# Patient Record
Sex: Male | Born: 1985 | Race: White | Hispanic: No | Marital: Single | Smoking: Never smoker
Health system: Southern US, Community
[De-identification: ages and names within clinical notes are randomized; demographics above are authoritative.]

## PROBLEM LIST (undated history)

## (undated) DIAGNOSIS — F79 Unspecified intellectual disabilities: Secondary | ICD-10-CM

## (undated) DIAGNOSIS — R066 Hiccough: Secondary | ICD-10-CM

## (undated) DIAGNOSIS — Z9189 Other specified personal risk factors, not elsewhere classified: Secondary | ICD-10-CM

## (undated) DIAGNOSIS — R1310 Dysphagia, unspecified: Secondary | ICD-10-CM

## (undated) DIAGNOSIS — R2681 Unsteadiness on feet: Secondary | ICD-10-CM

## (undated) DIAGNOSIS — K219 Gastro-esophageal reflux disease without esophagitis: Secondary | ICD-10-CM

## (undated) DIAGNOSIS — Q02 Microcephaly: Secondary | ICD-10-CM

## (undated) DIAGNOSIS — D649 Anemia, unspecified: Secondary | ICD-10-CM

## (undated) HISTORY — PX: CIRCUMCISION: SUR203

---
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
## 2009-09-04 ENCOUNTER — Encounter: Admission: RE | Admit: 2009-09-04 | Discharge: 2009-10-05 | Payer: Self-pay | Admitting: Family Medicine

## 2010-10-09 ENCOUNTER — Ambulatory Visit: Payer: Medicaid Other | Attending: Family Medicine | Admitting: Occupational Therapy

## 2010-10-09 DIAGNOSIS — F79 Unspecified intellectual disabilities: Secondary | ICD-10-CM | POA: Insufficient documentation

## 2010-10-09 DIAGNOSIS — IMO0001 Reserved for inherently not codable concepts without codable children: Secondary | ICD-10-CM | POA: Insufficient documentation

## 2010-10-09 DIAGNOSIS — M2560 Stiffness of unspecified joint, not elsewhere classified: Secondary | ICD-10-CM | POA: Insufficient documentation

## 2011-07-30 ENCOUNTER — Ambulatory Visit: Payer: Medicaid Other | Attending: Family Medicine | Admitting: Occupational Therapy

## 2011-07-30 ENCOUNTER — Ambulatory Visit: Payer: Medicaid Other | Admitting: Rehabilitative and Restorative Service Providers"

## 2011-07-30 DIAGNOSIS — IMO0001 Reserved for inherently not codable concepts without codable children: Secondary | ICD-10-CM | POA: Insufficient documentation

## 2011-07-30 DIAGNOSIS — R4189 Other symptoms and signs involving cognitive functions and awareness: Secondary | ICD-10-CM | POA: Insufficient documentation

## 2012-06-14 ENCOUNTER — Ambulatory Visit (HOSPITAL_COMMUNITY)
Admission: RE | Admit: 2012-06-14 | Discharge: 2012-06-14 | Disposition: A | Discharge status: Home / Self Care | Payer: Medicaid Other | Source: Ambulatory Visit | Attending: Family Medicine | Admitting: Family Medicine

## 2012-06-14 DIAGNOSIS — F79 Unspecified intellectual disabilities: Secondary | ICD-10-CM | POA: Insufficient documentation

## 2012-06-14 DIAGNOSIS — IMO0001 Reserved for inherently not codable concepts without codable children: Secondary | ICD-10-CM | POA: Insufficient documentation

## 2012-06-14 DIAGNOSIS — F8089 Other developmental disorders of speech and language: Secondary | ICD-10-CM | POA: Insufficient documentation

## 2012-06-14 NOTE — Evaluation (Signed)
Speech Language Pathology Evaluation Patient Details  Name: Connor Mata MRN: [id number] Date of Birth: 1986-02-14  Today's Date: 06/14/2012 Time: 1345-1445 SLP Time Calculation (min): 60 min  Authorization: Medicaid  Authorization Time Period:    Authorization Visit#:   of     Past Medical History: No past medical history on file. Past Surgical History: No past surgical history on file.  HPI:  Symptoms/Limitations Symptoms: "I want to go to [employer]." Special Tests: Portions of [employer] of Articulation as well as other informal measures Pain Assessment Currently in Pain?: No/denies  Prior Functional Status  Cognitive/Linguistic Baseline: Baseline deficits Baseline deficit details: Severe developmental delays, mental retardation Type of Home: Other (Comment) (Group home with 6 other residents) Available Help at Discharge: Personal care attendant;Available 24 hours/day  Connor Mata is a 27 yo male with medical history consistent with Severe Mental Retardation, Disruptive Behavior Disorder, Mild Cerebral Palsy, Kyphotic Posture, Knees Bent Gait, Microcephaly, and constipation. He currently resides in [facility] and is accompanied by a driver from his home today. He smiled and held out his hand for me to hold when I greeted him. He told me that he wanted to go to [employer].  Cognition   Informally assessed. Kees has severe mental retardation, but is able to grasp basic concepts.  Comprehension   Loki is able to follow simple directions, understand basic conversations and concepts. He benefits from slower rate of delivery and sometimes repetition. He needs reminders to follow directions in a specific order.   Expression   Quintavious primarily communicates via spoken words, gestures, behaviors, and touch/physical manipulation to express his wants/needs. Due to dysarthria and misarticulations, his speech can be difficult to understand. Specifically, he  substitutes "k" for /d/ and /t/ in most words, "th" for s, sh, and some s-blends, and has difficulty with consonant /l/ and /r/ blends. He speaks in single words and short phrases and is often repetitive/perseverative with questions.   Oral/Motor  Oral Motor/Sensory Function Overall Oral Motor/Sensory Function: Impaired Labial Strength: Reduced Labial Sensation: Within Functional Limits Lingual ROM: Reduced left;Reduced right Lingual Symmetry: Other (Comment) (difficult to move tongue separately from mandible) Lingual Strength: Reduced Lingual Sensation: Within Functional Limits Facial ROM: Within Functional Limits Velum:  (appears reduced) Mandible: Other (Comment) (pt with very high palatal arch, narrow) Motor Speech Overall Motor Speech: Impaired at baseline Respiration: Impaired Level of Impairment: Phrase Phonation: Normal Resonance: Hyponasality Articulation: Impaired Level of Impairment: Word Intelligibility: Intelligibility reduced Word: 25-49% accurate (improves significantly with context) Interfering Components: Premorbid status;Anatomical limitations;Hearing loss Effective Techniques: Slow rate;Pause  SLP Goals   N/A  Assessment/Plan  Kavion's chart was reviewed and his previous recommendations continue to be appropriate today. Rehaan benefits from routine and consistency. He is able to express his basic wants/needs verbally with reduced intelligibility and follow basic concepts and directions. Caregivers/staff would do well to communicate with Brandun in a quiet environment and provide concise requests when completing tasks. Should Jaidon need a speech/language evaluation annually, it would be more appropriate to have a therapist see Abner in his home setting to better assess communication needs and Engineer, mining.  SLP - End of Session Activity Tolerance: Patient tolerated treatment well General Behavior During Session: [facility] for tasks  performed Cognition: Impaired, at baseline     Thank you,  Havery Moros, CCC-SLP [phone]     PORTER,DABNEY 06/14/2012, 11:41 PM   Physician DocumentationYour signature is required to indicate approval of the treatment plan as stated above.  Please  sign and either send electronically or make a copy of this report for your files and return this physician signed original.  Please mark one 1.__approve of plan  2. ___approve of plan with the following conditions.   ______________________________                                                          _____________________ Physician Signature                                                                                                             Date

## 2013-05-03 ENCOUNTER — Emergency Department (HOSPITAL_COMMUNITY)
Admission: EM | Admit: 2013-05-03 | Discharge: 2013-05-03 | Disposition: A | Discharge status: Home / Self Care | Payer: Medicaid Other | Attending: Emergency Medicine | Admitting: Emergency Medicine

## 2013-05-03 ENCOUNTER — Encounter (HOSPITAL_COMMUNITY): Payer: Self-pay | Admitting: Emergency Medicine

## 2013-05-03 DIAGNOSIS — R112 Nausea with vomiting, unspecified: Secondary | ICD-10-CM | POA: Insufficient documentation

## 2013-05-03 DIAGNOSIS — Z79899 Other long term (current) drug therapy: Secondary | ICD-10-CM | POA: Insufficient documentation

## 2013-05-03 DIAGNOSIS — R066 Hiccough: Secondary | ICD-10-CM

## 2013-05-03 DIAGNOSIS — Z8659 Personal history of other mental and behavioral disorders: Secondary | ICD-10-CM | POA: Insufficient documentation

## 2013-05-03 HISTORY — DX: Unspecified intellectual disabilities: F79

## 2013-05-03 LAB — COMPREHENSIVE METABOLIC PANEL
AST: 17 U/L (ref 0–37)
Albumin: 3.5 g/dL (ref 3.5–5.2)
BUN: 12 mg/dL (ref 6–23)
CO2: 28 mEq/L (ref 19–32)
Calcium: 8.9 mg/dL (ref 8.4–10.5)
Creatinine, Ser: 0.82 mg/dL (ref 0.50–1.35)
GFR calc non Af Amer: >90 mL/min (ref >90)
Glucose, Bld: 110 mg/dL — ABNORMAL HIGH (ref 70–99)
Total Protein: 6.8 g/dL (ref 6.0–8.3)

## 2013-05-03 LAB — URINALYSIS, ROUTINE W REFLEX MICROSCOPIC
Bilirubin Urine: NEGATIVE
Hgb urine dipstick: NEGATIVE
Ketones, ur: 15 mg/dL — AB
Nitrite: NEGATIVE
Protein, ur: NEGATIVE mg/dL
Specific Gravity, Urine: 1.022 (ref 1.005–1.030)
Urobilinogen, UA: 4 mg/dL — ABNORMAL HIGH (ref 0.0–1.0)

## 2013-05-03 LAB — CBC WITH DIFFERENTIAL/PLATELET
Basophils Absolute: 0 10*3/uL (ref 0.0–0.1)
Hemoglobin: 10.5 g/dL — ABNORMAL LOW (ref 13.0–17.0)
Lymphocytes Relative: 28 % (ref 12–46)
Lymphs Abs: 1 10*3/uL (ref 0.7–4.0)
MCH: 32.4 pg (ref 26.0–34.0)
MCHC: 36.3 g/dL — ABNORMAL HIGH (ref 30.0–36.0)
Neutro Abs: 2 10*3/uL (ref 1.7–7.7)
Neutrophils Relative %: 55 % (ref 43–77)
Platelets: 182 10*3/uL (ref 150–400)
RBC: 3.24 MIL/uL — ABNORMAL LOW (ref 4.22–5.81)
RDW: 12.5 % (ref 11.5–15.5)
WBC: 3.7 10*3/uL — ABNORMAL LOW (ref 4.0–10.5)

## 2013-05-03 MED ORDER — ONDANSETRON 4 MG PO TBDP
8.0000 mg | ORAL_TABLET | ORAL | Status: AC
  Administered 2013-05-03: 8 mg via ORAL
  Filled 2013-05-03: qty 2

## 2013-05-03 MED ORDER — CHLORPROMAZINE HCL 25 MG PO TABS: 25.0000 mg | ORAL_TABLET | Freq: Three times a day (TID) | ORAL | Status: DC

## 2013-05-03 MED ORDER — ONDANSETRON HCL 4 MG/2ML IJ SOLN
4.0000 mg | Freq: Once | INTRAMUSCULAR | Status: AC
  Administered 2013-05-03: 4 mg via INTRAVENOUS
  Filled 2013-05-03: qty 2

## 2013-05-03 MED ORDER — SODIUM CHLORIDE 0.9 % IV BOLUS (SEPSIS)
1000.0000 mL | Freq: Once | INTRAVENOUS | Status: AC
  Administered 2013-05-03: 1000 mL via INTRAVENOUS

## 2013-05-03 MED ORDER — SODIUM CHLORIDE 0.9 % IV SOLN
50.0000 mg | Freq: Once | INTRAVENOUS | Status: AC
  Administered 2013-05-03: 50 mg via INTRAVENOUS
  Filled 2013-05-03: qty 2

## 2013-05-03 MED ORDER — ONDANSETRON 4 MG PO TBDP: 4.0000 mg | ORAL_TABLET | Freq: Three times a day (TID) | ORAL | Status: DC | PRN

## 2013-05-03 MED ORDER — OMEPRAZOLE 20 MG PO CPDR: 20.0000 mg | DELAYED_RELEASE_CAPSULE | Freq: Two times a day (BID) | ORAL | Status: DC

## 2013-05-03 MED ORDER — PANTOPRAZOLE SODIUM 40 MG IV SOLR
40.0000 mg | Freq: Once | INTRAVENOUS | Status: AC
  Administered 2013-05-03: 40 mg via INTRAVENOUS
  Filled 2013-05-03: qty 40

## 2013-05-03 NOTE — ED Notes (Signed)
Pt here from group home with family and care givers c/o hiccups x 1 week and N/V with some dark colored emesis on Saturday and then again today; pt started on paxil beginning of last week and decreased PO intake c/o abd pain; pt with hx of MR

## 2013-05-03 NOTE — ED Notes (Signed)
Patient has MR and is unable to give details on his pain level or description.

## 2013-05-03 NOTE — ED Provider Notes (Signed)
CSN: [id number]     Arrival date & time 05/03/13  1332 History   First MD Initiated Contact with Patient 05/03/13 1655     Chief Complaint  Patient presents with  . Abdominal Pain  . Emesis    HPI  Patient presents with his mother. His history of mental retardation and stays in a group home. He stays with mom every other weekend. Her last week he has had hiccups almost constantly. He said a few episodes of vomiting Sunday night and yesterday and then today. Sooner some "dark black" in his vomit but no frank blood. Mom cleaned up his emesis he did not turn red being cleaned. Is not taking any other medicines that are new for him.  He has a history of chronic leukopenia and mild anemia with a baseline hemoglobin of 10  Past Medical History  Diagnosis Date  . Mental retardation    History reviewed. No pertinent past surgical history. History reviewed. No pertinent family history. History  Substance Use Topics  . Smoking status: Never Smoker   . Smokeless tobacco: Not on file  . Alcohol Use: No    Review of Systems  Unable to perform ROS: Other  Constitutional: Negative for fever.  Respiratory: Negative for cough.   Gastrointestinal: Positive for nausea and abdominal pain.       Hiccups  Genitourinary: Negative for difficulty urinating.  Psychiatric/Behavioral: Negative for behavioral problems and agitation.    Allergies  Review of patient's allergies indicates no known allergies.  Home Medications   Current Outpatient Rx  Name  Route  Sig  Dispense  Refill  . clonazePAM (KLONOPIN) 1 MG tablet   Oral   Take 1 mg by mouth 2 (two) times daily.         . iron polysaccharides (NIFEREX) 150 MG capsule   Oral   Take 150 mg by mouth daily.         Marland Kitchen PARoxetine (PAXIL) 10 MG tablet   Oral   Take 10 mg by mouth daily.         . polyethylene glycol (MIRALAX / GLYCOLAX) packet   Oral   Take 17 g by mouth daily.         . risperidone (RISPERDAL) 4 MG tablet    Oral   Take 4 mg by mouth at bedtime.         . traZODone (DESYREL) 100 MG tablet   Oral   Take 100 mg by mouth at bedtime.         . chlorproMAZINE (THORAZINE) 25 MG tablet   Oral   Take 1 tablet (25 mg total) by mouth 3 (three) times daily.   30 tablet   0   . omeprazole (PRILOSEC) 20 MG capsule   Oral   Take 1 capsule (20 mg total) by mouth 2 (two) times daily.   60 capsule   0   . ondansetron (ZOFRAN ODT) 4 MG disintegrating tablet   Oral   Take 1 tablet (4 mg total) by mouth every 8 (eight) hours as needed for nausea.   20 tablet   0    BP 113/55  Pulse 96  Temp(Src) 98.7 F (37.1 C) (Oral)  Resp 16  SpO2 97% Physical Exam  Constitutional: He appears well-developed and well-nourished.  Hiccups approximately Once per Minute  HENT:  Pink and moist  Eyes:  Anicteric. Not pale.  Cardiovascular: S2 normal.   Pulmonary/Chest: He has no decreased breath sounds. He has no  wheezes. He has no rhonchi. He has no rales.  Abdominal:  Soft. Does not react as it is painful.  Skin:  Normal Capillary refill    ED Course  Procedures (including critical care time) Labs Review Labs Reviewed  CBC WITH DIFFERENTIAL - Abnormal; Notable for the following:    WBC 3.7 (*)    RBC 3.24 (*)    Hemoglobin 10.5 (*)    HCT 28.9 (*)    MCHC 36.3 (*)    Monocytes Relative 15 (*)    All other components within normal limits  COMPREHENSIVE METABOLIC PANEL - Abnormal; Notable for the following:    Glucose, Bld 110 (*)    All other components within normal limits  URINALYSIS, ROUTINE W REFLEX MICROSCOPIC - Abnormal; Notable for the following:    APPearance CLOUDY (*)    Ketones, ur 15 (*)    Urobilinogen, UA 4.0 (*)    All other components within normal limits  LIPASE, BLOOD   Imaging Review No results found.  EKG Interpretation   None       MDM   1. Hiccups    He is given IV Thorazine. He was given IV Protonix. He was given Zofran. He was sleeping comfortably  precise occasional hiccups. He decreased in frequency but not resolved. His hemoglobin is stable.  Symptoms started time he started Paxil. Although was not documented the Paxil as hiccups or masses providers to stop his Paxil. Given a prescription for Thorazine. Given a prescription for Zofran. He is given a prescription for Prilosec. Given referral for GI progressing to follow with his primary care physician and/or GI symptoms not resolve.  I've also asked that they stop his Risperdal to take the Thorazine so that he would not be overly sedate    Roney Marion, MD 05/03/13 2048

## 2013-06-06 ENCOUNTER — Other Ambulatory Visit: Payer: Self-pay | Admitting: Gastroenterology

## 2013-06-10 ENCOUNTER — Encounter (HOSPITAL_COMMUNITY): Payer: Self-pay | Admitting: Pharmacy Technician

## 2013-06-10 ENCOUNTER — Encounter (HOSPITAL_COMMUNITY): Payer: Self-pay | Admitting: *Deleted

## 2013-06-10 DIAGNOSIS — R1310 Dysphagia, unspecified: Secondary | ICD-10-CM

## 2013-06-10 DIAGNOSIS — Z9189 Other specified personal risk factors, not elsewhere classified: Secondary | ICD-10-CM

## 2013-06-10 DIAGNOSIS — R066 Hiccough: Secondary | ICD-10-CM

## 2013-06-10 DIAGNOSIS — D649 Anemia, unspecified: Secondary | ICD-10-CM

## 2013-06-10 DIAGNOSIS — R2681 Unsteadiness on feet: Secondary | ICD-10-CM

## 2013-06-10 HISTORY — DX: Other specified personal risk factors, not elsewhere classified: Z91.89

## 2013-06-10 HISTORY — DX: Unsteadiness on feet: R26.81

## 2013-06-10 HISTORY — DX: Anemia, unspecified: D64.9

## 2013-06-10 HISTORY — DX: Hiccough: R06.6

## 2013-06-10 HISTORY — DX: Dysphagia, unspecified: R13.10

## 2013-06-10 NOTE — Progress Notes (Signed)
06-10-13 Spoke with Dr. Acey Lavarignan of mother concerns- pt." May get too anxious and hard to handle", reassured they would be ready to handle situation with meds if needed.W. Kennon PortelaHendrick,RN

## 2013-06-17 ENCOUNTER — Encounter (HOSPITAL_COMMUNITY)
Admission: RE | Disposition: A | Discharge status: Home / Self Care | Payer: Self-pay | Source: Ambulatory Visit | Attending: Gastroenterology

## 2013-06-17 ENCOUNTER — Encounter (HOSPITAL_COMMUNITY): Payer: Medicaid Other | Admitting: Registered Nurse

## 2013-06-17 ENCOUNTER — Ambulatory Visit (HOSPITAL_COMMUNITY)
Admission: RE | Admit: 2013-06-17 | Discharge: 2013-06-17 | Disposition: A | Discharge status: Home / Self Care | Payer: Medicaid Other | Source: Ambulatory Visit | Attending: Gastroenterology | Admitting: Gastroenterology

## 2013-06-17 ENCOUNTER — Encounter (HOSPITAL_COMMUNITY): Payer: Self-pay

## 2013-06-17 ENCOUNTER — Ambulatory Visit (HOSPITAL_COMMUNITY): Payer: Medicaid Other | Admitting: Registered Nurse

## 2013-06-17 DIAGNOSIS — R1310 Dysphagia, unspecified: Secondary | ICD-10-CM | POA: Insufficient documentation

## 2013-06-17 DIAGNOSIS — R066 Hiccough: Secondary | ICD-10-CM | POA: Insufficient documentation

## 2013-06-17 DIAGNOSIS — K219 Gastro-esophageal reflux disease without esophagitis: Secondary | ICD-10-CM | POA: Insufficient documentation

## 2013-06-17 DIAGNOSIS — D649 Anemia, unspecified: Secondary | ICD-10-CM | POA: Insufficient documentation

## 2013-06-17 DIAGNOSIS — F79 Unspecified intellectual disabilities: Secondary | ICD-10-CM | POA: Insufficient documentation

## 2013-06-17 DIAGNOSIS — K449 Diaphragmatic hernia without obstruction or gangrene: Secondary | ICD-10-CM | POA: Insufficient documentation

## 2013-06-17 HISTORY — DX: Dysphagia, unspecified: R13.10

## 2013-06-17 HISTORY — PX: ESOPHAGOGASTRODUODENOSCOPY: SHX5428

## 2013-06-17 HISTORY — DX: Gastro-esophageal reflux disease without esophagitis: K21.9

## 2013-06-17 HISTORY — DX: Hiccough: R06.6

## 2013-06-17 HISTORY — DX: Other specified personal risk factors, not elsewhere classified: Z91.89

## 2013-06-17 HISTORY — DX: Unsteadiness on feet: R26.81

## 2013-06-17 HISTORY — DX: Anemia, unspecified: D64.9

## 2013-06-17 SURGERY — EGD (ESOPHAGOGASTRODUODENOSCOPY)
Anesthesia: Monitor Anesthesia Care

## 2013-06-17 MED ORDER — KETAMINE HCL 10 MG/ML IJ SOLN
INTRAMUSCULAR | Status: DC | PRN
  Administered 2013-06-17 (×2): 10 mg via INTRAVENOUS

## 2013-06-17 MED ORDER — SODIUM CHLORIDE 0.9 % IV SOLN: INTRAVENOUS | Status: DC

## 2013-06-17 MED ORDER — LIDOCAINE HCL (CARDIAC) 20 MG/ML IV SOLN
INTRAVENOUS | Status: DC | PRN
  Administered 2013-06-17: 50 mg via INTRAVENOUS

## 2013-06-17 MED ORDER — EPHEDRINE SULFATE 50 MG/ML IJ SOLN
INTRAMUSCULAR | Status: AC
  Filled 2013-06-17: qty 1

## 2013-06-17 MED ORDER — SODIUM CHLORIDE 0.9 % IJ SOLN
INTRAMUSCULAR | Status: AC
  Filled 2013-06-17: qty 10

## 2013-06-17 MED ORDER — PROPOFOL 10 MG/ML IV BOLUS
INTRAVENOUS | Status: AC
  Filled 2013-06-17: qty 20

## 2013-06-17 MED ORDER — MIDAZOLAM HCL 2 MG/2ML IJ SOLN
INTRAMUSCULAR | Status: AC
  Filled 2013-06-17: qty 2

## 2013-06-17 MED ORDER — KETAMINE HCL 10 MG/ML IJ SOLN
INTRAMUSCULAR | Status: AC
  Filled 2013-06-17: qty 1

## 2013-06-17 MED ORDER — PROPOFOL INFUSION 10 MG/ML OPTIME
INTRAVENOUS | Status: DC | PRN
  Administered 2013-06-17: 75 ug/kg/min via INTRAVENOUS

## 2013-06-17 MED ORDER — MIDAZOLAM HCL 5 MG/5ML IJ SOLN
INTRAMUSCULAR | Status: DC | PRN
  Administered 2013-06-17: .25 mg via INTRAVENOUS

## 2013-06-17 MED ORDER — LACTATED RINGERS IV SOLN
INTRAVENOUS | Status: DC
  Administered 2013-06-17: 10:00:00 via INTRAVENOUS

## 2013-06-17 MED ORDER — LIDOCAINE HCL (CARDIAC) 20 MG/ML IV SOLN
INTRAVENOUS | Status: AC
  Filled 2013-06-17: qty 5

## 2013-06-17 NOTE — Preoperative (Signed)
Beta Blockers   Reason not to administer Beta Blockers:Not Applicable 

## 2013-06-17 NOTE — H&P (Signed)
  Connor Mata HPI: This is a 28 year old male who was hospitalized for persistent singultus this past December.  He started to have issues with singultus a few weeks before, but on the da of admission, he had hiccups for 10 hours.  He was treated with thorazine and his symptoms abated.  The patient was also treated with BID omeprazole, but his records reflect that he has been on the medication before.  The patient is mentally retarded and his mother reports taht he started to have issues with swallowing.  He has been better over the past month, but he will have intermittent breakthroughs of his symptoms.  When he was in the ER he had coffee-ground emesis.  His mother wonders if there was any correlation with his antidepressant medications.  Past Medical History  Diagnosis Date  . GERD (gastroesophageal reflux disease)   . Anemia 06-10-13    hx. "low white cell count" "takes iron"-iron deficiency   . Swallowing problem 06-10-13    "not swallowing well with foods, meds okay"   . Potential for alteration in nutrition and elimination 06-10-13    identifies with prompt words "pea and poo" for elimination on schedule  . Hiccoughs 06-10-13    Intermittent periods of hiccoughs-tx. Thorazine.Extreme case x7days-caused vomiting of coffe ground material.  . Gait instability 06-10-13    unsteady gait  . Mental retardation     "repetive speech,hard to understand-"follows prompt commands"    Past Surgical History  Procedure Laterality Date  . Circumcision      History reviewed. No pertinent family history.  Social History:  reports that he has never smoked. He does not have any smokeless tobacco history on file. He reports that he does not drink alcohol or use illicit drugs.  Allergies: No Known Allergies  Medications:  Scheduled:  Continuous: . sodium chloride    . lactated ringers 50 mL/hr at 06/17/13 1008    No results found for this or any previous visit (from the past 24 hour(s)).   No  results found.  ROS:  As stated above in the HPI otherwise negative.  Blood pressure 102/61, pulse 58, temperature 97.5 F (36.4 C), temperature source Oral, resp. rate 10, height 4\' 7"  (1.397 m), weight 100 lb (45.36 kg), SpO2 100.00%.    PE: Gen: NAD, Alert and Oriented HEENT:  [place]/AT, EOMI Neck: Supple, no LAD Lungs: CTA Bilaterally CV: RRR without M/G/R ABM: Soft, NTND, +BS Ext: No C/C/E  Assessment/Plan: 1) Singultus. 2) Dysphagia.  Plan: 1) EGD.  Connor Mata 06/17/2013, 10:30 AM

## 2013-06-17 NOTE — Discharge Instructions (Signed)

## 2013-06-17 NOTE — Anesthesia Preprocedure Evaluation (Addendum)
Anesthesia Evaluation  Patient identified by MRN, date of birth, ID band Patient awake    Reviewed: Allergy & Precautions, H&P , NPO status , Patient's Chart, lab work & pertinent test results  Airway Mallampati: II TM Distance: >3 FB Neck ROM: Full    Dental  (+) Dental Advisory Given   Pulmonary neg pulmonary ROS,  breath sounds clear to auscultation        Cardiovascular negative cardio ROS  Rhythm:Regular Rate:Normal     Neuro/Psych negative neurological ROS  negative psych ROS   GI/Hepatic Neg liver ROS, GERD-  Medicated,  Endo/Other  negative endocrine ROS  Renal/GU negative Renal ROS     Musculoskeletal negative musculoskeletal ROS (+)   Abdominal   Peds  Hematology negative hematology ROS (+) anemia ,   Anesthesia Other Findings   Reproductive/Obstetrics                         Anesthesia Physical Anesthesia Plan  ASA: III  Anesthesia Plan: MAC   Post-op Pain Management:    Induction:   Airway Management Planned:   Additional Equipment:   Intra-op Plan:   Post-operative Plan:   Informed Consent: I have reviewed the patients History and Physical, chart, labs and discussed the procedure including the risks, benefits and alternatives for the proposed anesthesia with the patient or authorized representative who has indicated his/her understanding and acceptance.   Dental advisory given  Plan Discussed with: CRNA  Anesthesia Plan Comments:         Anesthesia Quick Evaluation

## 2013-06-17 NOTE — Anesthesia Postprocedure Evaluation (Signed)
Anesthesia Post Note  Patient: Connor Mata  Procedure(s) Performed: Procedure(s) (LRB): ESOPHAGOGASTRODUODENOSCOPY (EGD) (N/A)  Anesthesia type: MAC  Patient location: PACU  Post pain: Pain level controlled  Post assessment: Post-op Vital signs reviewed  Last Vitals: BP 94/65  Pulse 58  Temp(Src) 36.4 C (Oral)  Resp 10  Ht 4\' 7"  (1.397 m)  Wt 100 lb (45.36 kg)  BMI 23.24 kg/m2  SpO2 100%  Post vital signs: Reviewed  Level of consciousness: awake  Complications: No apparent anesthesia complications

## 2013-06-17 NOTE — Op Note (Signed)
[facility] [address] [place], [id number]   OPERATIVE PROCEDURE REPORT  PATIENT: [name], Connor  MR#: [id number] BIRTHDATE: 31-Mar-1986  GENDER: Male ENDOSCOPIST: Jeani HawkingPatrick Osceola Holian, MD ASSISTANT:   Tomma RakersBethany Bocanegra, RN, BSN, Windell HummingbirdSamuel Tetteh, technician, and Morene Antueginald Martin, PROCEDURE DATE: 06/17/2013 PROCEDURE:   EGD with biopsies ASA CLASS:   Class III INDICATIONS: Dysphagia and singultus MEDICATIONS: MAC sedation, administered by CRNA TOPICAL ANESTHETIC:   none  DESCRIPTION OF PROCEDURE:   After the risks benefits and alternatives of the procedure were thoroughly explained, informed consent was obtained.  The endoscope [id number]  endoscope was introduced through the mouth  and advanced to the second portion of the duodenum Without limitations.      The instrument was slowly withdrawn as the mucosa was fully examined.      FINDINGS: A small cervical inlet patch was identified in the proximal esophagus.  Cold biopsies of the esophagus were obtained for the possibility of EoE.  A 2-3 cm sliding hiatal hernia was found.  The Z-line subjectively appeared to have some irritation suggestive of GERD.  No other abnormalities identified. The scope was then withdrawn from the patient and the procedure terminated.  COMPLICATIONS: There were no complications. IMPRESSION: 1) Cervial inlet patch. 2) ? EoE. 3) Hiatal hernia.  RECOMMENDATIONS: 1) Await biopsy results. 2) Continue with PPI.  _______________________________ eSignedJeani Hawking:  Grisell Bissette, MD 06/17/2013 11:17 AM

## 2013-06-17 NOTE — Transfer of Care (Signed)
Immediate Anesthesia Transfer of Care Note  Patient: Connor Mata  Procedure(s) Performed: Procedure(s): ESOPHAGOGASTRODUODENOSCOPY (EGD) (N/A)  Patient Location: Endoscopy Unit  Anesthesia Type:MAC  Level of Consciousness: awake, alert , oriented and patient cooperative  Airway & Oxygen Therapy: Patient Spontanous Breathing and Patient connected to face mask oxygen  Post-op Assessment: Report given to PACU RN, Post -op Vital signs reviewed and stable and Patient moving all extremities  Post vital signs: Reviewed and stable  Complications: No apparent anesthesia complications

## 2013-06-20 ENCOUNTER — Encounter (HOSPITAL_COMMUNITY): Payer: Self-pay | Admitting: Gastroenterology

## 2013-09-06 ENCOUNTER — Encounter (HOSPITAL_COMMUNITY): Payer: Self-pay | Admitting: Emergency Medicine

## 2013-09-06 ENCOUNTER — Emergency Department (HOSPITAL_COMMUNITY)
Admission: EM | Admit: 2013-09-06 | Discharge: 2013-09-06 | Disposition: A | Discharge status: Home / Self Care | Payer: Medicaid Other | Attending: Emergency Medicine | Admitting: Emergency Medicine

## 2013-09-06 DIAGNOSIS — F79 Unspecified intellectual disabilities: Secondary | ICD-10-CM | POA: Insufficient documentation

## 2013-09-06 DIAGNOSIS — Z789 Other specified health status: Secondary | ICD-10-CM | POA: Insufficient documentation

## 2013-09-06 DIAGNOSIS — D649 Anemia, unspecified: Secondary | ICD-10-CM | POA: Insufficient documentation

## 2013-09-06 DIAGNOSIS — Z79899 Other long term (current) drug therapy: Secondary | ICD-10-CM | POA: Insufficient documentation

## 2013-09-06 DIAGNOSIS — R1310 Dysphagia, unspecified: Secondary | ICD-10-CM | POA: Insufficient documentation

## 2013-09-06 DIAGNOSIS — K219 Gastro-esophageal reflux disease without esophagitis: Secondary | ICD-10-CM | POA: Insufficient documentation

## 2013-09-06 DIAGNOSIS — R066 Hiccough: Secondary | ICD-10-CM | POA: Insufficient documentation

## 2013-09-06 DIAGNOSIS — R269 Unspecified abnormalities of gait and mobility: Secondary | ICD-10-CM | POA: Insufficient documentation

## 2013-09-06 LAB — I-STAT CHEM 8, ED
BUN: 12 mg/dL (ref 6–23)
CALCIUM ION: 1.19 mmol/L (ref 1.12–1.23)
CHLORIDE: 101 meq/L (ref 96–112)
Creatinine, Ser: 1.1 mg/dL (ref 0.50–1.35)
Glucose, Bld: 83 mg/dL (ref 70–99)
HCT: 37 % — ABNORMAL LOW (ref 39.0–52.0)
HEMOGLOBIN: 12.6 g/dL — AB (ref 13.0–17.0)
Potassium: 3.9 mEq/L (ref 3.7–5.3)
Sodium: 140 mEq/L (ref 137–147)
TCO2: 26 mmol/L (ref 0–100)

## 2013-09-06 MED ORDER — CHLORPROMAZINE HCL 25 MG/ML IJ SOLN
50.0000 mg | Freq: Once | INTRAMUSCULAR | Status: AC
  Administered 2013-09-06: 50 mg via INTRAMUSCULAR
  Filled 2013-09-06: qty 2

## 2013-09-06 MED ORDER — CHLORPROMAZINE HCL 25 MG PO TABS
25.0000 mg | ORAL_TABLET | Freq: Once | ORAL | Status: AC
  Administered 2013-09-06: 25 mg via ORAL
  Filled 2013-09-06: qty 1

## 2013-09-06 NOTE — ED Notes (Signed)
Wylene SimmerFrancis Sanford, PA at the bedside

## 2013-09-06 NOTE — Discharge Instructions (Signed)
Hiccups °Hiccups are caused by a sudden contraction of the muscles between the ribs and the muscle under your lungs (diaphragm). When you hiccup, the top of your windpipe (glottis) closes immediately after your diaphragm contracts. This makes the typical 'hic' sound. A hiccup is a reflex that you cannot stop. Unlike other reflexes such as coughing and sneezing, hiccups do not seem to have any useful purpose. There are 3 types of hiccups:  °· Benign bouts: last less than 48 hours. °· Persistent: last more than 48 hours but less than 1 month. °· Intractable: last more than 1 month. °CAUSES  °Most people have bouts of hiccups from time to time. They start for no apparent reason, last a short while, and then stop. Sometimes they are due to: °· A temporary swollen stomach caused by overeating or eating too fast, eating spicy foods, drinking fizzy drinks, or swallowing air. °· A sudden change in temperature (very hot or cold foods or drinks, a cold shower). °· Drinking alcohol or using tobacco. °There are no particular tests used to diagnose hiccups. Hiccups are usually considered harmless and do not point to a serious medical condition. However, there can be underlying medical problems that may cause hiccups, such as pneumonia, diabetes, metabolic problems, tumors, abdominal infections or injuries, and neurologic problems. You must follow up with your caregiver if your symptoms persist or become a frequent problem. °TREATMENT  °· Most cases need no treatment. A bout of benign hiccups usually does not last long. °· Medicine is sometimes needed to stop persistent hiccups. Medicine may be given intravenously (IV) or by mouth. °· Hypnosis or acupuncture may be suggested. °· Surgery to affect the nerve that supplies the diaphragm may be tried in severe cases. °· Treatment of an underlying cause is needed in some cases. °HOME CARE INSTRUCTIONS  °Popular remedies that may stop a bout of hiccups include: °· Gargling ice  water. °· Swallowing granulated sugar. °· Biting on a lemon. °· Holding your breath, breathing fast, or breathing into a paper bag. °· Bearing down. °· Gasping after a sudden fright. °· Pulling your tongue gently. °· Distraction. °SEEK MEDICAL CARE IF:  °· Hiccups last for more than 48 hours. °· You are given medicine but your hiccups do not get better. °· New symptoms show up. °· You cannot sleep or eat due to the hiccups. °· You have unexpected weight loss. °· You have trouble breathing or swallowing. °· You develop severe pain in your abdomen or other areas. °· You develop numbness, tingling, or weakness. °Document Released: 07/14/2001 Document Revised: 07/28/2011 Document Reviewed: 06/26/2010 °[employer]® Patient Information ©2014 [employer]. ° °

## 2013-09-06 NOTE — ED Notes (Addendum)
Patient presents with hiccups for 24 hours per family.  Per family he is lethargic from not sleeping through the night due to the hiccups, patient has MR, lives in a group home.  Patient has been seen for these symptoms before, is followed by Dr. Elnoria HowardHung.  Patient as a lot of secretions but patient is able to clear them, airway patent

## 2013-09-06 NOTE — ED Provider Notes (Signed)
Medical screening examination/treatment/procedure(s) were performed by non-physician practitioner and as supervising physician I was immediately available for consultation/collaboration.   EKG Interpretation None       Arleene Settle, MD 09/06/13 2040 

## 2013-09-06 NOTE — ED Provider Notes (Signed)
CSN: [id number]     Arrival date & time 09/06/13  1348 History   First MD Initiated Contact with Patient 09/06/13 1514     Chief Complaint  Patient presents with  . Hiccups     (Consider location/radiation/quality/duration/timing/severity/associated sxs/prior Treatment) HPI Comments: Patient is 28 year old male with history of chronic hiccups, MR and GERD who presents to the ED with his parents after they were called by the group home with a 24 hour history of intractible hiccoughs.  He has a history of same and 2 weeks ago she was able to bring him home, give him the oral thorazine with relief.  She states that previously he has had a similar episode which resulted in admission to the hospital because he began to have GI bleeding related to this.  She states that he has not wanted to eat and drink and that according to the group home he did not sleep well last night.  He denies any complaints at this time.  The history is provided by a parent. The history is limited by a developmental delay. No language interpreter was used.    Past Medical History  Diagnosis Date  . GERD (gastroesophageal reflux disease)   . Anemia 06-10-13    hx. "low white cell count" "takes iron"-iron deficiency   . Swallowing problem 06-10-13    "not swallowing well with foods, meds okay"   . Potential for alteration in nutrition and elimination 06-10-13    identifies with prompt words "pea and poo" for elimination on schedule  . Hiccoughs 06-10-13    Intermittent periods of hiccoughs-tx. Thorazine.Extreme case x7days-caused vomiting of coffe ground material.  . Gait instability 06-10-13    unsteady gait  . Mental retardation     "repetive speech,hard to understand-"follows prompt commands"   Past Surgical History  Procedure Laterality Date  . Circumcision    . Esophagogastroduodenoscopy N/A 06/17/2013    Procedure: ESOPHAGOGASTRODUODENOSCOPY (EGD);  Surgeon: Theda BelfastPatrick D Hung, MD;  Location: [facility];  Service:  Endoscopy;  Laterality: N/A;   No family history on file. History  Substance Use Topics  . Smoking status: Never Smoker   . Smokeless tobacco: Not on file  . Alcohol Use: No    Review of Systems  All other systems reviewed and are negative.     Allergies  Review of patient's allergies indicates no known allergies.  Home Medications   Prior to Admission medications   Medication Sig Start Date End Date Taking? Authorizing Provider  Camphor-Eucalyptus-Menthol (VICKS VAPORUB EX) Apply 1 application topically at bedtime. Applies to fingernails on fingers and toes for fungal infection.   Yes Historical Provider, MD  chlorproMAZINE (THORAZINE) 25 MG tablet Take 25 mg by mouth 3 (three) times daily as needed (For hiccups.).   Yes Historical Provider, MD  clonazePAM (KLONOPIN) 1 MG tablet Take 1 mg by mouth 2 (two) times daily.   Yes Historical Provider, MD  Fluvoxamine Maleate 150 MG CP24 Take 1 capsule by mouth at bedtime.   Yes Historical Provider, MD  iron polysaccharides (NIFEREX) 150 MG capsule Take 150 mg by mouth every morning.    Yes Historical Provider, MD  omeprazole (PRILOSEC) 20 MG capsule Take 20 mg by mouth every morning.   Yes Historical Provider, MD  ondansetron (ZOFRAN ODT) 4 MG disintegrating tablet Take 1 tablet (4 mg total) by mouth every 8 (eight) hours as needed for nausea. 05/03/13  Yes Rolland PorterMark James, MD  polyethylene glycol [facility](MIRALAX / GLYCOLAX) packet Take 17  g by mouth every morning.    Yes Historical Provider, MD  risperidone (RISPERDAL) 4 MG tablet Take 4 mg by mouth at bedtime.   Yes Historical Provider, MD  traZODone (DESYREL) 100 MG tablet Take 100 mg by mouth at bedtime.   Yes Historical Provider, MD   BP 114/73  Pulse 57  Temp(Src) 98 F (36.7 C) (Oral)  Resp 18  SpO2 99% Physical Exam  Nursing note and vitals reviewed. Constitutional: He is oriented to person, place, and time. He appears well-developed and well-nourished. No distress.  HENT:  Head:  Normocephalic and atraumatic.  Right Ear: External ear normal.  Left Ear: External ear normal.  Nose: Nose normal.  Mouth/Throat: Oropharynx is clear and moist. No oropharyngeal exudate.  Eyes: Conjunctivae are normal. Pupils are equal, round, and reactive to light. No scleral icterus.  Neck: Normal range of motion. Neck supple.  Cardiovascular: Normal rate, regular rhythm and normal heart sounds.  Exam reveals no gallop and no friction rub.   No murmur heard. Pulmonary/Chest: Effort normal and breath sounds normal. No respiratory distress. He has no wheezes. He has no rales. He exhibits no tenderness.  Abdominal: Soft. Bowel sounds are normal. He exhibits no distension. There is no tenderness. There is no rebound and no guarding.  hiccough  Musculoskeletal: Normal range of motion. He exhibits no edema and no tenderness.  Lymphadenopathy:    He has no cervical adenopathy.  Neurological: He is alert and oriented to person, place, and time. He exhibits normal muscle tone. Coordination normal.  Skin: Skin is warm and dry. No rash noted. No erythema. No pallor.  Psychiatric: He has a normal mood and affect. His behavior is normal. Judgment and thought content normal.    ED Course  Procedures (including critical care time) Labs Review Labs Reviewed  I-STAT CHEM 8, ED - Abnormal; Notable for the following:    Hemoglobin 12.6 (*)    HCT 37.0 (*)    All other components within normal limits    Imaging Review No results found.   EKG Interpretation None      Medications  chlorproMAZINE (THORAZINE) tablet 25 mg (25 mg Oral Given 09/06/13 1551)  chlorproMAZINE (THORAZINE) injection 50 mg (50 mg Intramuscular Given 09/06/13 1741)     MDM   Intractible hiccoughs  Patient here with parents who reports history of intractable hiccoughs, after IM injection of thorazine, this has now resolved.  No sign of dehydration and mother reports ready to take the patient home.    Izola PriceFrances C.  Marisue HumbleSanford, New JerseyPA-C 09/06/13 1921

## 2013-09-06 NOTE — ED Notes (Signed)
Pt has had recurrent episodes of hiccups, today has been going on for 24 hours. Mom states given thorazine tablets, but did not give today due to ongoing sedation from medicine.

## 2014-09-19 ENCOUNTER — Ambulatory Visit: Payer: Medicaid Other | Attending: Family Medicine | Admitting: Occupational Therapy

## 2015-08-20 ENCOUNTER — Other Ambulatory Visit: Payer: Self-pay | Admitting: Gastroenterology

## 2015-08-29 ENCOUNTER — Encounter (HOSPITAL_COMMUNITY): Payer: Self-pay | Admitting: *Deleted

## 2015-08-29 NOTE — Progress Notes (Signed)
Preop instructions for:  Connor Mata                       Date of Birth     06-Aug-1985                       Date of Procedure: 09-07-2015      Doctor: Connor Mata [facility]12 noon Report to: Admitting Procedure time:   1330 -1415 Procedure: Esophagogastroduodenoscopy Any procedure time changes, MD office will notify you!   Do not eat or drink past midnight the night before your procedure.(To include any tube feedings-must be discontinued): Exception may have Clear Liquids 12 midnight to 0700 AM day of procedure. Reminder:Follow bowel prep instructions per MD office!   Take these morning medications only with sips of water.(or give through gastrostomy or feeding tube).  Benztropine. Focalin. Invega. Clonazepam. Note: No Insulin or Diabetic meds should be given or taken the morning of the procedure!   Facility contact:  Connor SaxEmma Mata- medical tech                Phone: [phone]                  Health Care POA: Connor Mata, Mata [phone] cell(currently out of country)States" gave verbal consent already".  Transportation contact phone#: Connor Saxmma Mata [phone]  Please send day of procedure:current med list and with times meds last taken that day, confirm nothing by mouth status from what time, Patient Demographic info( to include DNR status, problem list, allergies)   RN contact name/phone#: Connor Mata [phone]                           and Fax #:[phone]  Connor InstrumentsBring Insurance card and picture ID Leave all jewelry and other valuables at place where living( no metal or rings to be worn) No contact lens Women-no make-up, no lotions,perfumes,powders Men-no colognes,lotions  Any questions day of procedure,call Connor Mata-[phone]!   Sent from :[facility]                   Phone:[phone]                   Fax:[phone]  Sent by :RN: Connor AsalWilhemina Aleesha Ringstad,RN Presurgical Testing Department [facility]_____

## 2015-09-06 NOTE — H&P (Signed)
  Connor Mata HPI: The patient was noticed to have issues with solid food dysphagia. In 05/2013 he was grossly exhibiting EoE and the biopsies confirmed this diagnosis. Over the past three months his symptoms have worsened to the point that his family was concerned about giving him solid food. One time his grandmother had to administer the Heimlich maneuver.  Past Medical History  Diagnosis Date  . GERD (gastroesophageal reflux disease)   . Anemia 06-10-13    hx. "low white cell count" "takes iron"-iron deficiency   . Swallowing problem 06-10-13    "not swallowing well with foods, meds okay"   . Potential for alteration in nutrition and elimination 06-10-13    identifies with prompt words "pea and poo" for elimination on schedule  . Hiccoughs 06-10-13    Intermittent periods of hiccoughs-tx. Thorazine.Extreme case x7days-caused vomiting of coffe ground material.  . Gait instability 06-10-13    unsteady gait  . Mental retardation     "repetative speech,hard to understand-"follows prompt commands"    Past Surgical History  Procedure Laterality Date  . Circumcision    . Esophagogastroduodenoscopy N/A 06/17/2013    Procedure: ESOPHAGOGASTRODUODENOSCOPY (EGD);  Surgeon: Theda BelfastPatrick D Roselynne Lortz, MD;  Location: [facility];  Service: Endoscopy;  Laterality: N/A;    History reviewed. No pertinent family history.  Social History:  reports that he has never smoked. He does not have any smokeless tobacco history on file. He reports that he does not drink alcohol or use illicit drugs.  Allergies: No Known Allergies  Medications: Scheduled: Continuous:  No results found for this or any previous visit (from the past 24 hour(s)).   No results found.  ROS:  As stated above in the HPI otherwise negative.  There were no vitals taken for this visit.    PE: Gen: NAD, Alert and Oriented HEENT:  [place]/AT, EOMI Neck: Supple, no LAD Lungs: CTA Bilaterally CV: RRR without M/G/R ABM: Soft, NTND, +BS Ext:  No C/C/E  Assessment/Plan: 1) Dysphagia and history of EoE - EGD with dilation.  Teneil Shiller D 09/06/2015, 12:36 PM

## 2015-09-07 ENCOUNTER — Encounter (HOSPITAL_COMMUNITY): Payer: Self-pay | Admitting: *Deleted

## 2015-09-07 ENCOUNTER — Ambulatory Visit (HOSPITAL_COMMUNITY): Payer: Medicaid Other | Admitting: Anesthesiology

## 2015-09-07 ENCOUNTER — Encounter (HOSPITAL_COMMUNITY)
Admission: RE | Disposition: A | Discharge status: Home / Self Care | Payer: Self-pay | Source: Ambulatory Visit | Attending: Gastroenterology

## 2015-09-07 ENCOUNTER — Ambulatory Visit (HOSPITAL_COMMUNITY)
Admission: RE | Admit: 2015-09-07 | Discharge: 2015-09-07 | Disposition: A | Discharge status: Home / Self Care | Payer: Medicaid Other | Source: Ambulatory Visit | Attending: Gastroenterology | Admitting: Gastroenterology

## 2015-09-07 DIAGNOSIS — K219 Gastro-esophageal reflux disease without esophagitis: Secondary | ICD-10-CM | POA: Diagnosis not present

## 2015-09-07 DIAGNOSIS — F79 Unspecified intellectual disabilities: Secondary | ICD-10-CM | POA: Insufficient documentation

## 2015-09-07 DIAGNOSIS — R1310 Dysphagia, unspecified: Secondary | ICD-10-CM | POA: Insufficient documentation

## 2015-09-07 HISTORY — PX: ESOPHAGOGASTRODUODENOSCOPY (EGD) WITH PROPOFOL: SHX5813

## 2015-09-07 SURGERY — ESOPHAGOGASTRODUODENOSCOPY (EGD) WITH PROPOFOL
Anesthesia: Monitor Anesthesia Care

## 2015-09-07 MED ORDER — PROPOFOL 10 MG/ML IV BOLUS
INTRAVENOUS | Status: AC
  Filled 2015-09-07: qty 20

## 2015-09-07 MED ORDER — PROPOFOL 500 MG/50ML IV EMUL
INTRAVENOUS | Status: DC | PRN
  Administered 2015-09-07: 30 mg via INTRAVENOUS

## 2015-09-07 MED ORDER — MIDAZOLAM HCL 2 MG/2ML IJ SOLN
INTRAMUSCULAR | Status: AC
  Filled 2015-09-07: qty 2

## 2015-09-07 MED ORDER — MIDAZOLAM HCL 5 MG/5ML IJ SOLN
INTRAMUSCULAR | Status: DC | PRN
  Administered 2015-09-07: 2 mg via INTRAVENOUS

## 2015-09-07 MED ORDER — PROPOFOL 500 MG/50ML IV EMUL
INTRAVENOUS | Status: DC | PRN
  Administered 2015-09-07: 100 ug/kg/min via INTRAVENOUS

## 2015-09-07 MED ORDER — LACTATED RINGERS IV SOLN
INTRAVENOUS | Status: DC | PRN
  Administered 2015-09-07: 14:00:00 via INTRAVENOUS

## 2015-09-07 SURGICAL SUPPLY — 14 items

## 2015-09-07 NOTE — Op Note (Signed)
[facility] Patient Name: Connor BeneDaniel Cornforth Procedure Date: 09/07/2015 MRN: [id number] Attending MD: Jeani HawkingPatrick Quorra Rosene , MD Date of Birth: Dec 31, 1985 CSN:  Age: [age] Admit Type: Outpatient Procedure:                Upper GI endoscopy Indications:              Dysphagia Providers:                Jeani HawkingPatrick Herman Fiero, MD, Omelia BlackwaterShelby Carpenter, RN, Arlee Muslimhris                            Chandler, Technician Referring MD:              Medicines:                Propofol per Anesthesia Complications:            No immediate complications. Estimated Blood Loss:     Estimated blood loss: none. Procedure:                Pre-Anesthesia Assessment:                           - Prior to the procedure, a History and Physical                            was performed, and patient medications and                            allergies were reviewed. The patient's tolerance of                            previous anesthesia was also reviewed. The risks                            and benefits of the procedure and the sedation                            options and risks were discussed with the patient.                            All questions were answered, and informed consent                            was obtained. Prior Anticoagulants: The patient has                            taken no previous anticoagulant or antiplatelet                            agents. ASA Grade Assessment: III - A patient with                            severe systemic disease. After reviewing the risks                            and benefits,  the patient was deemed in                            satisfactory condition to undergo the procedure.                           - Sedation was administered by an anesthesia                            professional. Deep sedation was attained.                           After obtaining informed consent, the endoscope was                            passed under direct vision. Throughout the                   procedure, the patient's blood pressure, pulse, and                            oxygen saturations were monitored continuously. The                            [id number]) scope was introduced through the                            mouth, and advanced to the second part of duodenum.                            The upper GI endoscopy was accomplished without                            difficulty. The patient tolerated the procedure                            well. Scope In: Scope Out: Findings:      The examined esophagus was normal. A TTS dilator was passed through the       scope. Dilation with a 15-16.5-18 mm balloon dilator was performed to 18       mm. The dilation site was examined following endoscope reinsertion and       showed no change. Estimated blood loss: none.      The stomach was normal.      The examined duodenum was normal.      The patient has a history of EoE. No overt stricture was identified.       From the GE junction the dilation balloon was inflated to 18 mm and the       esophageal pull-through method was performed. As the balloon was pulled       through the esohpagus the balloon did not catch on any strictures. The       balloon was also pulled through the UES without resistance. No overt       evidence of stricture up to 18 mm. Impression:               - Normal esophagus. Dilated.                           -  Normal stomach.                           - Normal examined duodenum.                           - No specimens collected. Moderate Sedation:      N/A- Per Anesthesia Care Recommendation:           - Patient has a contact number available for                            emergencies. The signs and symptoms of potential                            delayed complications were discussed with the                            patient. Return to normal activities tomorrow.                            Written discharge instructions were provided to  the                            patient.                           - Resume previous diet.                           - Continue present medications.                           - Return to GI office in 4 weeks. Procedure Code(s):        --- Professional ---                           [phone], Esophagogastroduodenoscopy, flexible,                            transoral; with transendoscopic balloon dilation of                            esophagus (less than 30 mm diameter) Diagnosis Code(s):        --- Professional ---                           R13.10, Dysphagia, unspecified CPT copyright 2016 American Medical Association. All rights reserved. The codes documented in this report are preliminary and upon coder review may  be revised to meet current compliance requirements. Jeani Hawking, MD Jeani Hawking, MD 09/07/2015 2:19:26 PM This report has been signed electronically. Number of Addenda: 0

## 2015-09-07 NOTE — Transfer of Care (Signed)
Immediate Anesthesia Transfer of Care Note  Patient: Connor Mata  Procedure(s) Performed: Procedure(s): ESOPHAGOGASTRODUODENOSCOPY (EGD) WITH PROPOFOL (N/A)  Patient Location: PACU  Anesthesia Type:MAC  Level of Consciousness:  sedated, patient cooperative and responds to stimulation  Airway & Oxygen Therapy:Patient Spontanous Breathing and Patient connected to face mask oxgen  Post-op Assessment:  Report given to PACU RN and Post -op Vital signs reviewed and stable  Post vital signs:  Reviewed and stable  Last Vitals:  Filed Vitals:   09/07/15 1223  BP: 92/64  Pulse: 73  Temp: 36.7 C  Resp: 20    Complications: No apparent anesthesia complications

## 2015-09-07 NOTE — Progress Notes (Signed)
Patient has cognitive defecits which make it difficult to assess.  Patient lives in group home.

## 2015-09-07 NOTE — Discharge Instructions (Signed)

## 2015-09-07 NOTE — Anesthesia Postprocedure Evaluation (Signed)
Anesthesia Post Note  Patient: Connor Mata  Procedure(s) Performed: Procedure(s) (LRB): ESOPHAGOGASTRODUODENOSCOPY (EGD) WITH PROPOFOL (N/A)  Patient location during evaluation: PACU Anesthesia Type: MAC Level of consciousness: awake and alert Pain management: pain level controlled Vital Signs Assessment: post-procedure vital signs reviewed and stable Respiratory status: spontaneous breathing, nonlabored ventilation, respiratory function stable and patient connected to nasal cannula oxygen Cardiovascular status: stable and blood pressure returned to baseline Anesthetic complications: no    Last Vitals:  Filed Vitals:   09/07/15 1430 09/07/15 1440  BP: 98/64 106/68  Pulse: 58 63  Temp:    Resp: 16 15    Last Pain: There were no vitals filed for this visit.               Kenric Ginger J

## 2015-09-07 NOTE — Progress Notes (Signed)
Unable to assess.  No physical signs of abuse or neglect.  Patient presents emotionally comfortable/happy.

## 2015-09-07 NOTE — Anesthesia Preprocedure Evaluation (Addendum)
Anesthesia Evaluation  Patient identified by MRN, date of birth, ID band Patient awake    Reviewed: Allergy & Precautions, NPO status , Patient's Chart, lab work & pertinent test results  Airway Mallampati: II  TM Distance: >3 FB Neck ROM: Full    Dental no notable dental hx.    Pulmonary neg pulmonary ROS,    Pulmonary exam normal breath sounds clear to auscultation       Cardiovascular negative cardio ROS Normal cardiovascular exam Rhythm:Regular Rate:Normal     Neuro/Psych PSYCHIATRIC DISORDERS negative neurological ROS     GI/Hepatic Neg liver ROS, GERD  Medicated,  Endo/Other  negative endocrine ROS  Renal/GU negative Renal ROS  negative genitourinary   Musculoskeletal negative musculoskeletal ROS (+)   Abdominal   Peds negative pediatric ROS (+)  Hematology  (+) anemia ,   Anesthesia Other Findings   Reproductive/Obstetrics negative OB ROS                             Anesthesia Physical Anesthesia Plan  ASA: II  Anesthesia Plan: MAC   Post-op Pain Management:    Induction: Intravenous  Airway Management Planned: Natural Airway  Additional Equipment:   Intra-op Plan:   Post-operative Plan:   Informed Consent: I have reviewed the patients History and Physical, chart, labs and discussed the procedure including the risks, benefits and alternatives for the proposed anesthesia with the patient or authorized representative who has indicated his/her understanding and acceptance.   Dental advisory given  Plan Discussed with: CRNA  Anesthesia Plan Comments:         Anesthesia Quick Evaluation

## 2015-09-11 ENCOUNTER — Encounter (HOSPITAL_COMMUNITY): Payer: Self-pay | Admitting: Gastroenterology

## 2020-04-03 ENCOUNTER — Ambulatory Visit: Payer: Medicaid Other | Admitting: Neurology

## 2020-04-03 ENCOUNTER — Encounter: Payer: Self-pay | Admitting: Neurology

## 2020-04-03 VITALS — BP 103/73 | HR 56 | Ht <= 58 in | Wt 103.0 lb

## 2020-04-03 DIAGNOSIS — F79 Unspecified intellectual disabilities: Secondary | ICD-10-CM | POA: Insufficient documentation

## 2020-04-03 DIAGNOSIS — R269 Unspecified abnormalities of gait and mobility: Secondary | ICD-10-CM

## 2020-04-03 DIAGNOSIS — R569 Unspecified convulsions: Secondary | ICD-10-CM | POA: Diagnosis not present

## 2020-04-03 MED ORDER — DIVALPROEX SODIUM ER 500 MG PO TB24: 500.0000 mg | ORAL_TABLET | Freq: Every day | ORAL | 11 refills | Status: DC

## 2020-04-03 NOTE — Patient Instructions (Signed)
Stop Keppra   Start Depakote ER 500mg  every night.  Check lab before his next follow up visit  EEG.
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
## 2020-06-11 ENCOUNTER — Other Ambulatory Visit: Payer: Self-pay

## 2020-06-11 ENCOUNTER — Ambulatory Visit: Payer: Medicaid Other | Admitting: Neurology

## 2020-06-11 DIAGNOSIS — R269 Unspecified abnormalities of gait and mobility: Secondary | ICD-10-CM

## 2020-06-11 DIAGNOSIS — R569 Unspecified convulsions: Secondary | ICD-10-CM

## 2020-06-11 DIAGNOSIS — F79 Unspecified intellectual disabilities: Secondary | ICD-10-CM

## 2020-06-18 ENCOUNTER — Ambulatory Visit: Payer: Medicaid Other | Admitting: Neurology

## 2020-06-18 ENCOUNTER — Encounter: Payer: Self-pay | Admitting: Neurology

## 2020-06-18 VITALS — BP 95/67 | HR 85 | Ht <= 58 in | Wt 105.5 lb

## 2020-06-18 DIAGNOSIS — R269 Unspecified abnormalities of gait and mobility: Secondary | ICD-10-CM | POA: Diagnosis not present

## 2020-06-18 DIAGNOSIS — F79 Unspecified intellectual disabilities: Secondary | ICD-10-CM

## 2020-06-18 DIAGNOSIS — R569 Unspecified convulsions: Secondary | ICD-10-CM
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
## 2020-06-18 NOTE — Procedures (Signed)
   HISTORY: 35 year old male with history of mental retardation, presented with seizure  TECHNIQUE:  This is a routine 16 channel EEG recording with one channel devoted to a limited EKG recording.  It was performed during wakefulness, drowsiness and asleep.  Photic stimulation were performed as activating procedures.  There are frequent movement and muscle artifact, he was not able to cooperative on hyperventilation  Upon maximum arousal, posterior dominant waking rhythm consistent of rhythmic rhythm, but was interrupted by frequent electrode, movement, eye blinking artifact, there was no epileptiform discharge  Hyperventilation was not performed early Photic stimulation did not alter the tracing.  During EEG recording, patient did not attend any drowsiness or sleep  EKG demonstrate sinus rhythm, with heart rate of 64 bpm  CONCLUSION: This is a technical challenging study, because patient's mental retardation, was not able to keep still during exam, there was no significant abnormality noted other than the frequent movement artifact.  Levert Feinstein, M.D. Ph.D.  [facility] [address] [place], [place] [address] Phone: [phone] Fax:      [phone]

## 2020-06-19 ENCOUNTER — Telehealth: Payer: Self-pay | Admitting: Neurology

## 2020-06-19 LAB — CBC WITH DIFFERENTIAL
Basophils Absolute: 0 10*3/uL (ref 0.0–0.2)
Basos: 1 %
EOS (ABSOLUTE): 0 10*3/uL (ref 0.0–0.4)
Eos: 1 %
Hematocrit: 37.6 % (ref 37.5–51.0)
Hemoglobin: 13.3 g/dL (ref 13.0–17.7)
Immature Grans (Abs): 0 10*3/uL (ref 0.0–0.1)
Immature Granulocytes: 0 %
Lymphocytes Absolute: 0.6 10*3/uL — ABNORMAL LOW (ref 0.7–3.1)
Lymphs: 19 %
MCH: 32.5 pg (ref 26.6–33.0)
MCHC: 35.4 g/dL (ref 31.5–35.7)
MCV: 92 fL (ref 79–97)
Monocytes Absolute: 0.6 10*3/uL (ref 0.1–0.9)
Monocytes: 16 %
Neutrophils Absolute: 2.2 10*3/uL (ref 1.4–7.0)
Neutrophils: 63 %
RBC: 4.09 x10E6/uL — ABNORMAL LOW (ref 4.14–5.80)
RDW: 12.7 % (ref 11.6–15.4)
WBC: 3.5 10*3/uL (ref 3.4–10.8)

## 2020-06-19 LAB — COMPREHENSIVE METABOLIC PANEL
ALT: 17 IU/L (ref 0–44)
AST: 21 IU/L (ref 0–40)
Albumin/Globulin Ratio: 1.3 (ref 1.2–2.2)
Albumin: 4.3 g/dL (ref 4.0–5.0)
Alkaline Phosphatase: 68 IU/L (ref 44–121)
BUN/Creatinine Ratio: 19 (ref 9–20)
BUN: 17 mg/dL (ref 6–20)
Bilirubin Total: 0.3 mg/dL (ref 0.0–1.2)
CO2: 25 mmol/L (ref 20–29)
Calcium: 9.2 mg/dL (ref 8.7–10.2)
Chloride: 104 mmol/L (ref 96–106)
Creatinine, Ser: 0.89 mg/dL (ref 0.76–1.27)
GFR calc Af Amer: 130 mL/min/{1.73_m2} (ref >59)
GFR calc non Af Amer: 112 mL/min/{1.73_m2} (ref >59)
Globulin, Total: 3.3 g/dL (ref 1.5–4.5)
Glucose: 89 mg/dL (ref 65–99)
Potassium: 4.1 mmol/L (ref 3.5–5.2)
Sodium: 144 mmol/L (ref 134–144)
Total Protein: 7.6 g/dL (ref 6.0–8.5)

## 2020-06-19 LAB — VALPROIC ACID LEVEL: Valproic Acid Lvl: 24 ug/mL — ABNORMAL LOW (ref 50–100)

## 2020-06-19 NOTE — Telephone Encounter (Addendum)
The patient resides in [facility] [phone]). I spoke to one of his caregivers, Kara Mead, and she verbalized understanding of the information below. She requested a copy of the results be faxed to her attention at [phone].

## 2020-06-19 NOTE — Telephone Encounter (Signed)
Please call patient, laboratory evaluation showed low Depakote level 24, rest of the laboratory evaluation showed no significant abnormalities,  Keep current management

## 2020-12-17 ENCOUNTER — Ambulatory Visit: Payer: Medicaid Other | Admitting: Neurology

## 2021-01-04 ENCOUNTER — Encounter (HOSPITAL_COMMUNITY): Payer: Self-pay

## 2021-01-04 ENCOUNTER — Emergency Department (HOSPITAL_COMMUNITY): Payer: Medicaid Other

## 2021-01-04 ENCOUNTER — Other Ambulatory Visit: Payer: Self-pay

## 2021-01-04 ENCOUNTER — Emergency Department (HOSPITAL_COMMUNITY)
Admission: EM | Admit: 2021-01-04 | Discharge: 2021-01-04 | Disposition: A | Discharge status: Home / Self Care | Payer: Medicaid Other | Attending: Emergency Medicine | Admitting: Emergency Medicine

## 2021-01-04 DIAGNOSIS — Z20822 Contact with and (suspected) exposure to covid-19: Secondary | ICD-10-CM | POA: Insufficient documentation

## 2021-01-04 DIAGNOSIS — R531 Weakness: Secondary | ICD-10-CM

## 2021-01-04 HISTORY — DX: Microcephaly: Q02

## 2021-01-04 LAB — CBC WITH DIFFERENTIAL/PLATELET
Basophils Absolute: 0 10*3/uL (ref 0.0–0.1)
Basophils Relative: 0 %
Eosinophils Absolute: 0 10*3/uL (ref 0.0–0.5)
Eosinophils Relative: 1 %
HCT: 41.5 % (ref 39.0–52.0)
Hemoglobin: 14.4 g/dL (ref 13.0–17.0)
Lymphocytes Relative: 55 %
Lymphs Abs: 1.4 10*3/uL (ref 0.7–4.0)
MCH: 33 pg (ref 26.0–34.0)
MCHC: 34.7 g/dL (ref 30.0–36.0)
MCV: 95.2 fL (ref 80.0–100.0)
Monocytes Absolute: 0.4 10*3/uL (ref 0.1–1.0)
Monocytes Relative: 17 %
Neutro Abs: 0.7 10*3/uL — ABNORMAL LOW (ref 1.7–7.7)
Neutrophils Relative %: 27 %
Platelets: 164 10*3/uL (ref 150–400)
RBC: 4.36 MIL/uL (ref 4.22–5.81)
RDW: 13.1 % (ref 11.5–15.5)
WBC: 2.6 10*3/uL — ABNORMAL LOW (ref 4.0–10.5)
nRBC: 0 % (ref 0.0–0.2)

## 2021-01-04 LAB — COMPREHENSIVE METABOLIC PANEL
ALT: 19 U/L (ref 0–44)
AST: 22 U/L (ref 15–41)
Albumin: 4.1 g/dL (ref 3.5–5.0)
Alkaline Phosphatase: 57 U/L (ref 38–126)
Anion gap: 6 (ref 5–15)
BUN: 16 mg/dL (ref 6–20)
CO2: 29 mmol/L (ref 22–32)
Calcium: 9 mg/dL (ref 8.9–10.3)
Chloride: 102 mmol/L (ref 98–111)
Creatinine, Ser: 0.93 mg/dL (ref 0.61–1.24)
GFR, Estimated: >60 mL/min (ref >60)
Glucose, Bld: 73 mg/dL (ref 70–99)
Potassium: 4 mmol/L (ref 3.5–5.1)
Sodium: 137 mmol/L (ref 135–145)
Total Bilirubin: 0.5 mg/dL (ref 0.3–1.2)
Total Protein: 7.6 g/dL (ref 6.5–8.1)

## 2021-01-04 LAB — TROPONIN I (HIGH SENSITIVITY)
Troponin I (High Sensitivity): <2 ng/L (ref <18)
Troponin I (High Sensitivity): <2 ng/L (ref <18)

## 2021-01-04 LAB — VALPROIC ACID LEVEL: Valproic Acid Lvl: 32 ug/mL — ABNORMAL LOW (ref 50.0–100.0)

## 2021-01-04 LAB — RESP PANEL BY RT-PCR (FLU A&B, COVID) ARPGX2
Influenza A by PCR: NEGATIVE
Influenza B by PCR: NEGATIVE
SARS Coronavirus 2 by RT PCR: NEGATIVE

## 2021-01-04 MED ORDER — SODIUM CHLORIDE 0.9 % IV BOLUS
500.0000 mL | Freq: Once | INTRAVENOUS | Status: AC
  Administered 2021-01-04: 500 mL via INTRAVENOUS

## 2021-01-04 MED ORDER — SODIUM CHLORIDE 0.9 % IV BOLUS
1000.0000 mL | Freq: Once | INTRAVENOUS | Status: AC
  Administered 2021-01-04: 1000 mL via INTRAVENOUS

## 2021-01-04 NOTE — ED Notes (Signed)
Dr assessed during triage 

## 2021-01-04 NOTE — ED Notes (Signed)
Nurse spoke with mom, Tammy. She will be here in about an hour. Tammy stated Mr. Felicita Gage [phone] and Kara Mead [phone] would have better health information for Dr.

## 2021-01-04 NOTE — ED Notes (Signed)
Staff member at [employer] called ... Connor Mata. Stated pt has been declining for about a week. Needing more help to walk, has decreased energy and strength and today looked as if he was going to pass out. Dr aware

## 2021-01-04 NOTE — Discharge Instructions (Addendum)
Continue drinking plenty of fluids and follow-up with your family doctor if any problems

## 2021-01-04 NOTE — ED Provider Notes (Signed)
[facility] EMERGENCY DEPARTMENT Provider Note   CSN: [id number] Arrival date & time: 01/04/21  1043     History Chief Complaint  Patient presents with   Weakness    Connor Mata is a 35 y.o. male.  Patient was brought to the emergency department because of being more weak than usual for the last few days.  Questionable worse speech.  Patient has a history of MR and has microcephaly-  The history is provided by the nursing home and a relative.  Weakness Severity:  Mild Onset quality:  Sudden Timing:  Intermittent Progression:  Resolved Chronicity:  New Context: not alcohol use   Relieved by:  Nothing Worsened by:  Nothing     Past Medical History:  Diagnosis Date   Anemia 06/10/2013   hx. "low white cell count" "takes iron"-iron deficiency    Gait instability 06/10/2013   unsteady gait   GERD (gastroesophageal reflux disease)    Hiccoughs 06/10/2013   Intermittent periods of hiccoughs-tx. Thorazine.Extreme case x7days-caused vomiting of coffe ground material.   Mental retardation    "repetative speech,hard to understand-"follows prompt commands"   Microcephaly (HCC)    from [facility] diagnosis   Potential for alteration in nutrition and elimination 06/10/2013   identifies with prompt words "pea and poo" for elimination on schedule   Swallowing problem 06/10/2013   "not swallowing well with foods, meds okay"     Patient Active Problem List   Diagnosis Date Noted   Mental disability 04/03/2020   Seizures (HCC) 04/03/2020   Gait abnormality 04/03/2020    Past Surgical History:  Procedure Laterality Date   CIRCUMCISION     ESOPHAGOGASTRODUODENOSCOPY N/A 06/17/2013   Procedure: ESOPHAGOGASTRODUODENOSCOPY (EGD);  Surgeon: Theda Belfast, MD;  Location: [facility];  Service: Endoscopy;  Laterality: N/A;   ESOPHAGOGASTRODUODENOSCOPY (EGD) WITH PROPOFOL N/A 09/07/2015   Procedure: ESOPHAGOGASTRODUODENOSCOPY (EGD) WITH PROPOFOL;  Surgeon: Jeani Hawking, MD;   Location: [facility];  Service: Endoscopy;  Laterality: N/A;       No family history on file.  Social History   Tobacco Use   Smoking status: Never   Smokeless tobacco: Never  Substance Use Topics   Alcohol use: No   Drug use: No    Home Medications Prior to Admission medications   Medication Sig Start Date End Date Taking? Authorizing Provider  ABILIFY 2 MG tablet Take 2 mg by mouth every morning. 06/14/20  Yes [provider]  clonazePAM (KLONOPIN) 1 MG tablet Take 1 mg by mouth 2 (two) times daily.   Yes [provider]  cloNIDine (CATAPRES) 0.1 MG tablet Take 0.05 mg by mouth 2 (two) times daily.    Yes [provider]  divalproex (DEPAKOTE ER) 500 MG 24 hr tablet Take 1 tablet (500 mg total) by mouth at bedtime. 04/03/20  Yes Levert Feinstein, MD  gabapentin (NEURONTIN) 100 MG capsule Take 100 mg by mouth daily. 01/02/21  Yes [provider]  omeprazole (PRILOSEC) 20 MG capsule Take 20 mg by mouth every morning.   Yes [provider]  paliperidone (INVEGA) 6 MG 24 hr tablet Take 6 mg by mouth daily.   Yes [provider]  polyethylene glycol (MIRALAX / GLYCOLAX) packet Take 17 g by mouth every morning.    Yes [provider]  gabapentin (NEURONTIN) 300 MG capsule Take 300 mg by mouth 2 (two) times daily. Patient not taking: No sig reported    [provider]    Allergies    Patient has  no known allergies.  Review of Systems   Review of Systems  Unable to perform ROS: Mental status change  Neurological:  Positive for weakness.   Physical Exam Updated Vital Signs BP 101/78   Pulse 64   Temp (!) 97.4 F (36.3 C) (Oral)   Resp 17   Ht 4\' 9"  (1.448 m)   Wt 47.9 kg   SpO2 100%   BMI 22.85 kg/m   Physical Exam Vitals and nursing note reviewed.  Constitutional:      Appearance: He is well-developed.  HENT:     Head:     Comments: Microcephaly    Nose: Nose normal.     Mouth/Throat:     Mouth:  Mucous membranes are moist.  Eyes:     General: No scleral icterus.    Conjunctiva/sclera: Conjunctivae normal.  Neck:     Thyroid: No thyromegaly.  Cardiovascular:     Rate and Rhythm: Normal rate and regular rhythm.     Heart sounds: No murmur heard.   No friction rub. No gallop.  Pulmonary:     Breath sounds: No stridor. No wheezing or rales.  Chest:     Chest wall: No tenderness.  Abdominal:     General: There is no distension.     Tenderness: There is no abdominal tenderness. There is no rebound.  Musculoskeletal:        General: No deformity.     Cervical back: Neck supple.  Lymphadenopathy:     Cervical: No cervical adenopathy.  Skin:    General: Skin is warm.     Findings: No erythema or rash.  Neurological:     Mental Status: He is alert. Mental status is at baseline.     Motor: No abnormal muscle tone.     Coordination: Coordination normal.  Psychiatric:        Behavior: Behavior normal.    ED Results / Procedures / Treatments   Labs (all labs ordered are listed, but only abnormal results are displayed) Labs Reviewed  CBC WITH DIFFERENTIAL/PLATELET - Abnormal; Notable for the following components:      Result Value   WBC 2.6 (*)    Neutro Abs 0.7 (*)    All other components within normal limits  VALPROIC ACID LEVEL - Abnormal; Notable for the following components:   Valproic Acid Lvl 32 (*)    All other components within normal limits  RESP PANEL BY RT-PCR (FLU A&B, COVID) ARPGX2  COMPREHENSIVE METABOLIC PANEL  TROPONIN I (HIGH SENSITIVITY)  TROPONIN I (HIGH SENSITIVITY)    EKG None  Radiology CT HEAD WO CONTRAST ( )  Result Date: 01/04/2021 CLINICAL DATA:  Cerebral hemorrhage suspected EXAM: CT HEAD WITHOUT CONTRAST TECHNIQUE: Contiguous axial images were obtained from the base of the skull through the vertex without intravenous contrast. 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 Vascular: No hyperdense vessel or unexpected calcification. Skull: Normal. Negative for fracture or focal lesion. Sinuses/Orbits: No acute finding. Other: Unchanged partially empty sella. IMPRESSION: No acute intracranial abnormality. 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 Unchanged cerebellar atrophy. Electronically Signed   By: 04/29/2020 M.D.   On: 01/04/2021 13:43   DG Chest Port 1 View  Result Date: 01/04/2021 CLINICAL DATA:  Weakness EXAM: PORTABLE CHEST 1 VIEW COMPARISON:  03/03/2020 FINDINGS: The heart size  and mediastinal contours are within normal limits. No focal airspace consolidation, pleural effusion, or pneumothorax. The visualized skeletal structures are unremarkable. IMPRESSION: No active disease. Electronically Signed   By: Duanne Guess D.O.   On: 01/04/2021 11:42    Procedures Procedures   Medications Ordered in ED Medications  sodium chloride 0.9 % bolus 500 mL (0 mLs Intravenous Stopped 01/04/21 1226)  sodium chloride 0.9 % bolus 1,000 mL (1,000 mLs Intravenous New Bag/Given 01/04/21 1324)    ED Course  I have reviewed the triage vital signs and the nursing notes.  Pertinent labs & imaging results that were available during my care of the patient were reviewed by me and considered in my medical decision making (see chart for details).    MDM Rules/Calculators/A&P                           Labs unremarkable.  Patient's father stated that he seems to be back to his normal now..  Patient will be sent back to the group home Final Clinical Impression(s) / ED Diagnoses Final diagnoses:  Generalized weakness    Rx / DC Orders ED Discharge Orders     None        Bethann Berkshire, MD 01/07/21 1546

## 2021-01-04 NOTE — ED Triage Notes (Signed)
Pt arrived by [facility] from [facility] with c/o weakness. EMS stated  pt has declined in speech and walking.

## 2021-01-04 NOTE — ED Notes (Signed)
Pt pulled out IV and unhooked himself from all leads, Pt clothes wet from IV fluids. Pt changed into gown and placed back into bed with tv on.

## 2021-01-14 ENCOUNTER — Other Ambulatory Visit: Payer: Self-pay | Admitting: Gastroenterology

## 2021-01-14 ENCOUNTER — Encounter: Payer: Self-pay | Admitting: Neurology

## 2021-01-14 ENCOUNTER — Ambulatory Visit: Payer: Medicaid Other | Admitting: Neurology

## 2021-01-14 VITALS — BP 99/65 | HR 84 | Ht <= 58 in | Wt 108.0 lb

## 2021-01-14 DIAGNOSIS — R269 Unspecified abnormalities of gait and mobility: Secondary | ICD-10-CM

## 2021-01-14 DIAGNOSIS — R569 Unspecified convulsions: Secondary | ICD-10-CM | POA: Diagnosis not present

## 2021-01-14 DIAGNOSIS — F79 Unspecified intellectual disabilities: Secondary | ICD-10-CM | POA: Diagnosis not present
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
## 2021-01-15 LAB — CBC WITH DIFFERENTIAL
Basophils Absolute: 0 10*3/uL (ref 0.0–0.2)
Basos: 1 %
EOS (ABSOLUTE): 0 10*3/uL (ref 0.0–0.4)
Eos: 1 %
Hematocrit: 40.7 % (ref 37.5–51.0)
Hemoglobin: 13.8 g/dL (ref 13.0–17.7)
Immature Grans (Abs): 0 10*3/uL (ref 0.0–0.1)
Immature Granulocytes: 0 %
Lymphocytes Absolute: 1.3 10*3/uL (ref 0.7–3.1)
Lymphs: 42 %
MCH: 31.9 pg (ref 26.6–33.0)
MCHC: 33.9 g/dL (ref 31.5–35.7)
MCV: 94 fL (ref 79–97)
Monocytes Absolute: 0.5 10*3/uL (ref 0.1–0.9)
Monocytes: 17 %
Neutrophils Absolute: 1.2 10*3/uL — ABNORMAL LOW (ref 1.4–7.0)
Neutrophils: 39 %
RBC: 4.33 x10E6/uL (ref 4.14–5.80)
RDW: 13.6 % (ref 11.6–15.4)
WBC: 3.1 10*3/uL — ABNORMAL LOW (ref 3.4–10.8)

## 2021-01-15 LAB — GABAPENTIN LEVEL: Gabapentin Lvl: 4.4 ug/mL (ref 4.0–16.0)

## 2021-01-16 LAB — URINE CULTURE

## 2021-01-23 NOTE — Progress Notes (Signed)
Preop instructions for:  Connor Mata Date of Birth:  [birth year]                     Date of Procedure:  01/25/2021 Procedure:     ESOPHAGOGASTRODUODENOSCOPY (EGD) WITH PROPOFOL, BALLOON DILATION Surgeon: Dr. Jeani Hawking Facility contact: [facility]    Phone:    [phone]           Health Care POA: contact name/phone#:      Kara Mead                    and Fax #: [phone]   Transportation contact phone#: Tammy and Hassell Halim [phone]    [facility] to [facility] at [facility]: 7:00 AM   Report to: Admitting (On your left hand side)    Do not eat or drink past midnight the night before your procedure.(To include any tube feedings-must be discontinued)   Take these morning medications only with sips of water.(or give through gastrostomy or feeding tube). Clonazepam, Gabapentin, Invega, Omeprazole, and Clonidine    Please send day of procedure:current med list and meds last taken that day, confirm nothing by mouth status from what time, Patient Demographic info( to include DNR status, problem list, allergies)   Bring Insurance card and picture ID Leave all jewelry and other valuables at place where living( no metal or rings to be worn) No contact lens  Men-no colognes,lotions   Any questions day of procedure,call  SHORT STAY-[phone]     Sent from :[facility]                   Phone:[phone]                   Fax:[phone]   Sent by :  Rockwell Alexandria BSN, RN

## 2021-01-25 ENCOUNTER — Encounter (HOSPITAL_COMMUNITY): Payer: Self-pay | Admitting: Gastroenterology

## 2021-01-25 ENCOUNTER — Encounter (HOSPITAL_COMMUNITY)
Admission: RE | Disposition: A | Discharge status: Home / Self Care | Payer: Self-pay | Source: Ambulatory Visit | Attending: Gastroenterology

## 2021-01-25 ENCOUNTER — Ambulatory Visit (HOSPITAL_COMMUNITY): Payer: Medicaid Other | Admitting: Anesthesiology

## 2021-01-25 ENCOUNTER — Ambulatory Visit (HOSPITAL_COMMUNITY)
Admission: RE | Admit: 2021-01-25 | Discharge: 2021-01-25 | Disposition: A | Discharge status: Home / Self Care | Payer: Medicaid Other | Source: Ambulatory Visit | Attending: Gastroenterology | Admitting: Gastroenterology

## 2021-01-25 ENCOUNTER — Other Ambulatory Visit: Payer: Self-pay

## 2021-01-25 DIAGNOSIS — R1310 Dysphagia, unspecified: Secondary | ICD-10-CM | POA: Insufficient documentation

## 2021-01-25 HISTORY — PX: SAVORY DILATION: SHX5439

## 2021-01-25 HISTORY — PX: ESOPHAGOGASTRODUODENOSCOPY (EGD) WITH PROPOFOL: SHX5813

## 2021-01-25 SURGERY — ESOPHAGOGASTRODUODENOSCOPY (EGD) WITH PROPOFOL
Anesthesia: Monitor Anesthesia Care

## 2021-01-25 MED ORDER — LACTATED RINGERS IV SOLN: INTRAVENOUS | Status: DC

## 2021-01-25 MED ORDER — PROPOFOL 10 MG/ML IV BOLUS
INTRAVENOUS | Status: DC | PRN
  Administered 2021-01-25: 40 mg via INTRAVENOUS

## 2021-01-25 MED ORDER — PROPOFOL 500 MG/50ML IV EMUL
INTRAVENOUS | Status: DC | PRN
  Administered 2021-01-25: 125 ug/kg/min via INTRAVENOUS

## 2021-01-25 SURGICAL SUPPLY — 14 items

## 2021-01-25 NOTE — Anesthesia Preprocedure Evaluation (Signed)
Anesthesia Evaluation  Patient identified by MRN, date of birth, ID band Patient awake    Reviewed: Allergy & Precautions, NPO status , Patient's Chart, lab work & pertinent test results  Airway Mallampati: II  TM Distance: >3 FB Neck ROM: Full    Dental no notable dental hx.    Pulmonary neg pulmonary ROS,    Pulmonary exam normal breath sounds clear to auscultation       Cardiovascular negative cardio ROS Normal cardiovascular exam Rhythm:Regular Rate:Normal     Neuro/Psych Developmentally delayed negative psych ROS   GI/Hepatic Neg liver ROS, GERD  ,  Endo/Other  negative endocrine ROS  Renal/GU negative Renal ROS  negative genitourinary   Musculoskeletal negative musculoskeletal ROS (+)   Abdominal   Peds negative pediatric ROS (+)  Hematology negative hematology ROS (+)   Anesthesia Other Findings   Reproductive/Obstetrics negative OB ROS                             Anesthesia Physical Anesthesia Plan  ASA: 2  Anesthesia Plan: MAC   Post-op Pain Management:    Induction: Intravenous  PONV Risk Score and Plan: 1 and Propofol infusion and Treatment may vary due to age or medical condition  Airway Management Planned: Simple Face Mask  Additional Equipment:   Intra-op Plan:   Post-operative Plan:   Informed Consent: I have reviewed the patients History and Physical, chart, labs and discussed the procedure including the risks, benefits and alternatives for the proposed anesthesia with the patient or authorized representative who has indicated his/her understanding and acceptance.     Dental advisory given  Plan Discussed with: CRNA and Surgeon  Anesthesia Plan Comments:         Anesthesia Quick Evaluation

## 2021-01-25 NOTE — H&P (Signed)
Connor Mata HPI: His last EGD with an 18 mm balloon dilation, which was pulled through the esophagus, was on 09/07/2015.  It was a normal examination.  In 2019 he was evaluated in the office for a single choking episode.  Over this interval time period the patient's mother reports that he suffered with three siezures.  His most recent seizure was two weeks ago.  For the past 3 months his mental status changed and he seems to be more lethargic.  Also, it is not completely clear if he truly has seizures.  His parents are wondering if any of the medications are causing him to have a mental status change.  He was noted to have more choking of late and he has to be reminded to swallow his food.  If he does not swallow his food it tends to drool out of his mouth.    Past Medical History:  Diagnosis Date   Anemia 06/10/2013   hx. "low white cell count" "takes iron"-iron deficiency    Gait instability 06/10/2013   unsteady gait   GERD (gastroesophageal reflux disease)    Hiccoughs 06/10/2013   Intermittent periods of hiccoughs-tx. Thorazine.Extreme case x7days-caused vomiting of coffe ground material.   Mental retardation    "repetative speech,hard to understand-"follows prompt commands"   Microcephaly (HCC)    from [facility] diagnosis   Potential for alteration in nutrition and elimination 06/10/2013   identifies with prompt words "pea and poo" for elimination on schedule   Swallowing problem 06/10/2013   "not swallowing well with foods, meds okay"     Past Surgical History:  Procedure Laterality Date   CIRCUMCISION     ESOPHAGOGASTRODUODENOSCOPY N/A 06/17/2013   Procedure: ESOPHAGOGASTRODUODENOSCOPY (EGD);  Surgeon: Theda Belfast, MD;  Location: [facility];  Service: Endoscopy;  Laterality: N/A;   ESOPHAGOGASTRODUODENOSCOPY (EGD) WITH PROPOFOL N/A 09/07/2015   Procedure: ESOPHAGOGASTRODUODENOSCOPY (EGD) WITH PROPOFOL;  Surgeon: Jeani Hawking, MD;  Location: [facility];  Service: Endoscopy;   Laterality: N/A;    No family history on file.  Social History:  reports that he has never smoked. He has never used smokeless tobacco. He reports that he does not drink alcohol and does not use drugs.  Allergies: No Known Allergies  Medications: Scheduled: Continuous:  lactated ringers 10 mL/hr at 01/25/21 0850    No results found for this or any previous visit (from the past 24 hour(s)).   No results found.  ROS:  As stated above in the HPI otherwise negative.  There were no vitals taken for this visit.    PE: Gen: NAD, Alert HEENT:  [place]/AT, EOMI Neck: Supple, no LAD Lungs: CTA Bilaterally CV: RRR without M/G/R ABD: Soft, NTND, +BS Ext: No C/C/E  Assessment/Plan: 1) Dysphagia - EGD with dilation  Aino Heckert D 01/25/2021, 7:02 AM

## 2021-01-25 NOTE — Op Note (Signed)
[facility] Patient Name: Connor Mata Procedure Date: 01/25/2021 MRN: [id number] Attending MD: Jeani Hawking , MD Date of Birth: 1985-11-10 CSN: [id number] Age: 35 Admit Type: Outpatient Procedure:                Upper GI endoscopy Indications:              Dysphagia Providers:                Jeani Hawking, MD, Estella Husk RN, RN, Cathlean Marseilles, RN Referring MD:              Medicines:                Propofol per Anesthesia Complications:            No immediate complications. Estimated Blood Loss:     Estimated blood loss: none. Procedure:                Pre-Anesthesia Assessment:                           - Prior to the procedure, a History and Physical                            was performed, and patient medications and                            allergies were reviewed. The patient's tolerance of                            previous anesthesia was also reviewed. The risks                            and benefits of the procedure and the sedation                            options and risks were discussed with the patient.                            All questions were answered, and informed consent                            was obtained. Prior Anticoagulants: The patient has                            taken no previous anticoagulant or antiplatelet                            agents. ASA Grade Assessment: III - A patient with                            severe systemic disease. After reviewing the risks                            and benefits, the patient was deemed in  satisfactory condition to undergo the procedure.                           - Sedation was administered by an anesthesia                            professional. Deep sedation was attained.                           After obtaining informed consent, the endoscope was                            passed under direct vision. Throughout the                            procedure, the  patient's blood pressure, pulse, and                            oxygen saturations were monitored continuously. The                            [id number]-H190 ([id number]) Olympus endoscope was introduced                            through the mouth, and advanced to the second part                            of duodenum. The upper GI endoscopy was                            accomplished without difficulty. The patient                            tolerated the procedure well. Scope In: Scope Out: Findings:      No endoscopic abnormality was evident in the esophagus to explain the       patient's complaint of dysphagia. It was decided, however, to proceed       with dilation of the entire esophagus. A guidewire was placed and the       scope was withdrawn. Dilation was performed with a Savary dilator with       no resistance at 18 mm. The dilation site was examined following       endoscope reinsertion and showed no change.      The stomach was normal.      The examined duodenum was normal.      A technical issue occurred and no images were captured. Impression:               - No endoscopic esophageal abnormality to explain                            patient's dysphagia. Esophagus dilated. Dilated.                           - Normal stomach.                           -  Normal examined duodenum.                           - No specimens collected. Moderate Sedation:      Not Applicable - Patient had care per Anesthesia. Recommendation:           - Patient has a contact number available for                            emergencies. The signs and symptoms of potential                            delayed complications were discussed with the                            patient. Return to normal activities tomorrow.                            Written discharge instructions were provided to the                            patient.                           - Resume previous diet.                           -  Continue present medications.                           - Return to GI clinic in 4 weeks. Procedure Code(s):        --- Professional ---                           [phone], Esophagogastroduodenoscopy, flexible,                            transoral; with insertion of guide wire followed by                            passage of dilator(s) through esophagus over guide                            wire Diagnosis Code(s):        --- Professional ---                           R13.10, Dysphagia, unspecified CPT copyright 2019 American Medical Association. All rights reserved. The codes documented in this report are preliminary and upon coder review may  be revised to meet current compliance requirements. Jeani Hawking, MD Jeani Hawking, MD 01/25/2021 9:08:57 AM This report has been signed electronically. Number of Addenda: 0

## 2021-01-25 NOTE — Anesthesia Postprocedure Evaluation (Signed)
Anesthesia Post Note  Patient: Connor Mata  Procedure(s) Performed: ESOPHAGOGASTRODUODENOSCOPY (EGD) WITH PROPOFOL SAVORY DILATION     Patient location during evaluation: PACU Anesthesia Type: MAC Level of consciousness: awake and alert Pain management: pain level controlled Vital Signs Assessment: post-procedure vital signs reviewed and stable Respiratory status: spontaneous breathing, nonlabored ventilation, respiratory function stable and patient connected to nasal cannula oxygen Cardiovascular status: stable and blood pressure returned to baseline Postop Assessment: no apparent nausea or vomiting Anesthetic complications: no   No notable events documented.  Last Vitals:  Vitals:   01/25/21 0926 01/25/21 0930  BP:  107/75  Pulse: (!) 54 (!) 55  Resp: 15 11  Temp:    SpO2: 100% 100%    Last Pain:  Vitals:   01/25/21 0930  TempSrc:   PainSc: 0-No pain                 Idali Lafever S

## 2021-01-25 NOTE — Transfer of Care (Signed)
Immediate Anesthesia Transfer of Care Note  Patient: Connor Mata  Procedure(s) Performed: ESOPHAGOGASTRODUODENOSCOPY (EGD) WITH PROPOFOL SAVORY DILATION  Patient Location: PACU and Endoscopy Unit  Anesthesia Type:MAC  Level of Consciousness: drowsy  Airway & Oxygen Therapy: Patient Spontanous Breathing  Post-op Assessment: Report given to RN and Post -op Vital signs reviewed and stable  Post vital signs: Reviewed and stable  Last Vitals:  Vitals Value Taken Time  BP 77/48 01/25/21 0913  Temp    Pulse 43 01/25/21 0915  Resp 15 01/25/21 0915  SpO2 100 % 01/25/21 0915  Vitals shown include unvalidated device data.  Last Pain:  Vitals:   01/25/21 0832  TempSrc: Oral  PainSc: 0-No pain         Complications: No notable events documented.

## 2021-01-25 NOTE — Discharge Instructions (Signed)

## 2021-01-26 ENCOUNTER — Encounter (HOSPITAL_COMMUNITY): Payer: Self-pay | Admitting: Gastroenterology

## 2021-03-20 ENCOUNTER — Other Ambulatory Visit: Payer: Self-pay | Admitting: Neurology

## 2021-11-11 ENCOUNTER — Telehealth: Payer: Self-pay | Admitting: Neurology

## 2021-11-14 NOTE — Telephone Encounter (Signed)
Late entry: Even emergency room called on-call physician report patient had recurrent seizure, recovered back to baseline, was discharged home without medication changes,  Patient was on Depakote, level was not checked, Keppra level was less than 2, but patient was not on Keppra,  Follow-up is scheduled

## 2022-01-07 ENCOUNTER — Ambulatory Visit: Payer: Medicaid Other | Admitting: Neurology

## 2022-03-03 ENCOUNTER — Ambulatory Visit (INDEPENDENT_AMBULATORY_CARE_PROVIDER_SITE_OTHER): Payer: Medicaid Other | Admitting: Neurology

## 2022-03-03 ENCOUNTER — Encounter: Payer: Self-pay | Admitting: Neurology

## 2022-03-03 VITALS — BP 105/66 | HR 71 | Ht <= 58 in | Wt 111.0 lb

## 2022-03-03 DIAGNOSIS — R269 Unspecified abnormalities of gait and mobility: Secondary | ICD-10-CM | POA: Diagnosis not present

## 2022-03-03 DIAGNOSIS — R569 Unspecified convulsions: Secondary | ICD-10-CM

## 2022-03-03 DIAGNOSIS — F79 Unspecified intellectual disabilities: Secondary | ICD-10-CM | POA: Diagnosis not present

## 2022-03-03 MED ORDER — DIVALPROEX SODIUM ER 500 MG PO TB24: 500.0000 mg | ORAL_TABLET | Freq: Every day | ORAL | 3 refills | Status: DC
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
## 2022-08-03 IMAGING — CT CT HEAD W/O CM
4 series · 15 of 47 positions shown, 17 images · non-contrast
Comparison: CT head 02/28/2020

CLINICAL DATA: Cerebral hemorrhage suspected

EXAM:
CT HEAD WITHOUT CONTRAST
TECHNIQUE: Contiguous axial images were obtained from the base of the skull
through the vertex without intravenous contrast.

[Series 4: coronal soft · coronal · 0.32mm/px · 3 of 67 slices shown]
[im 23/67  brain]
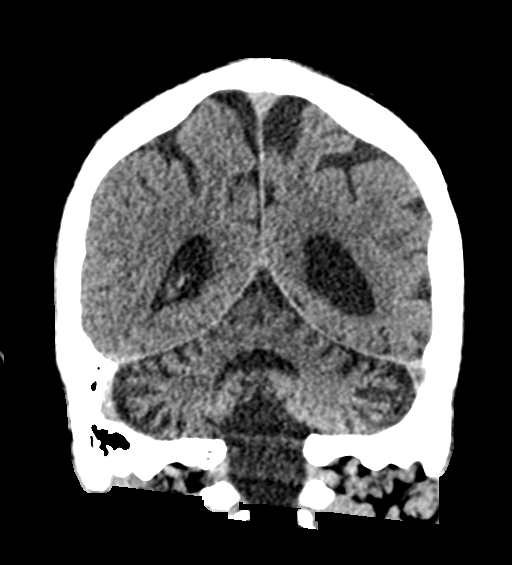
[im 30/67  brain]
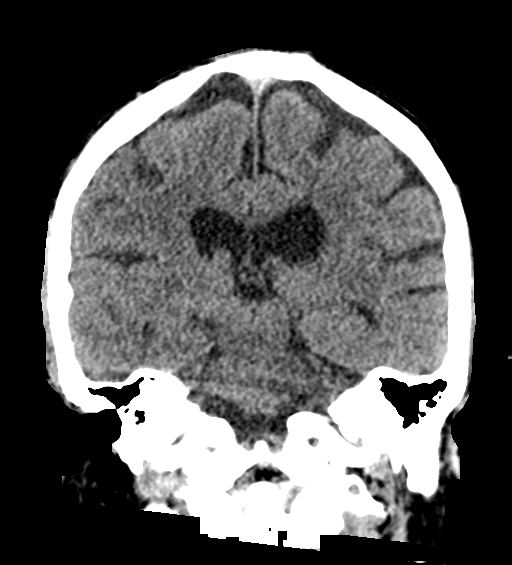
[im 37/67  brain]
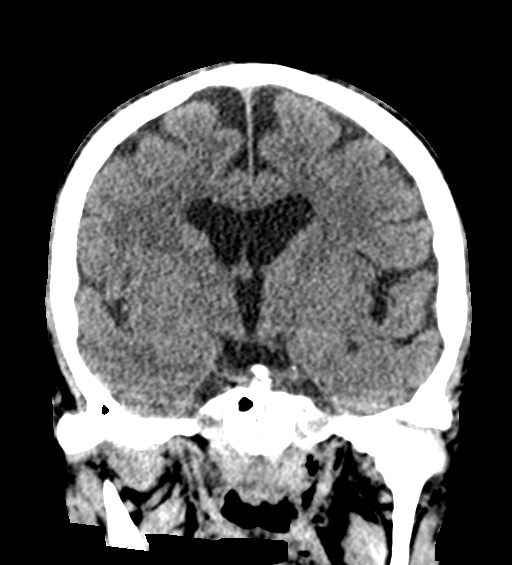

[Series 5: sagittal soft · sagittal · 0.36mm/px · 3 of 56 slices shown]
[im 20/56  brain]
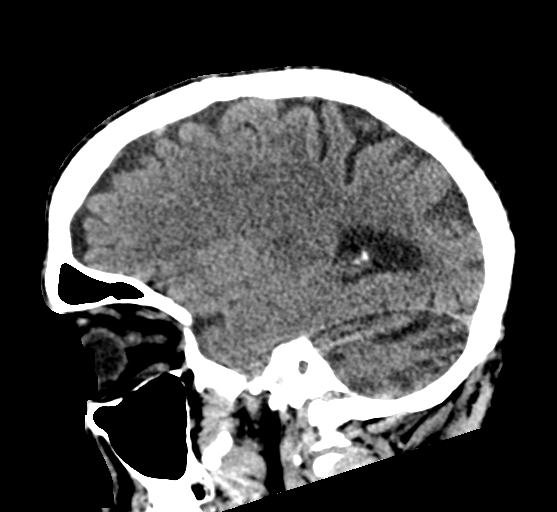
[im 28/56  brain]
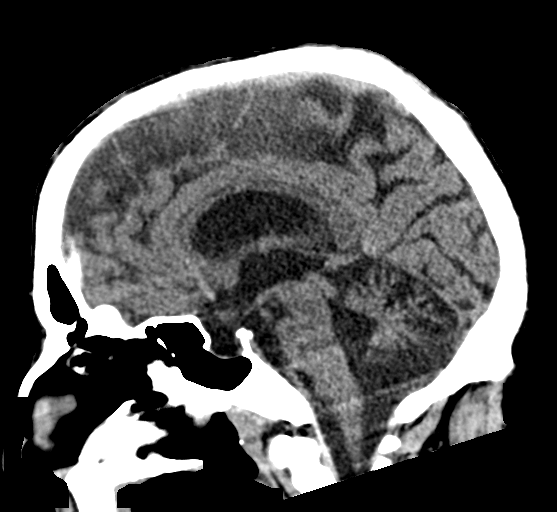
[im 36/56  brain]
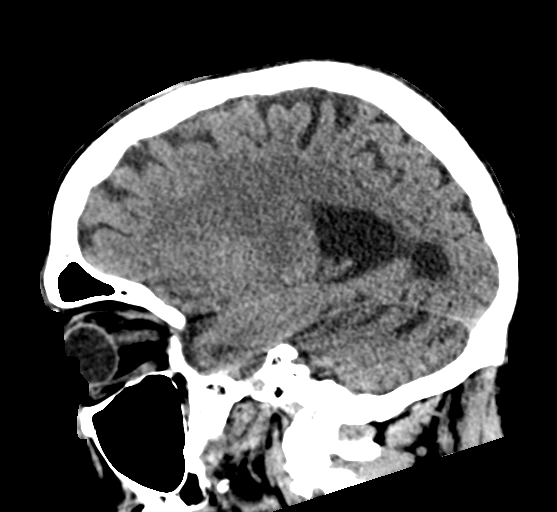

[Series 6: head ax w o · axial · 0.32mm/px · z∈[+41,+163]mm · 7 of 37 slices shown, 9 images]
[im 5/37  brain]
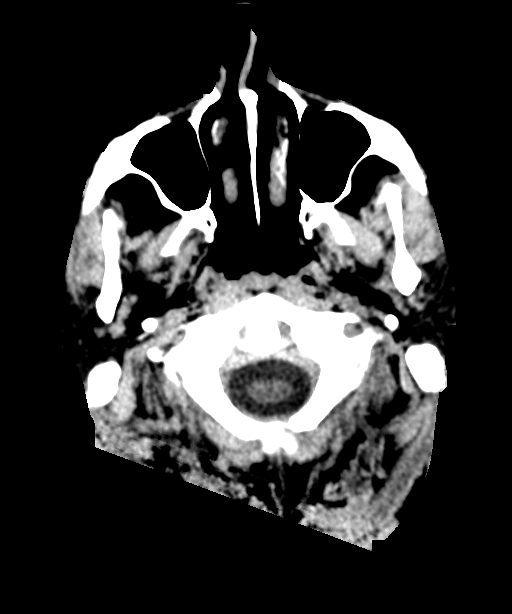
[im 5/37  bone]
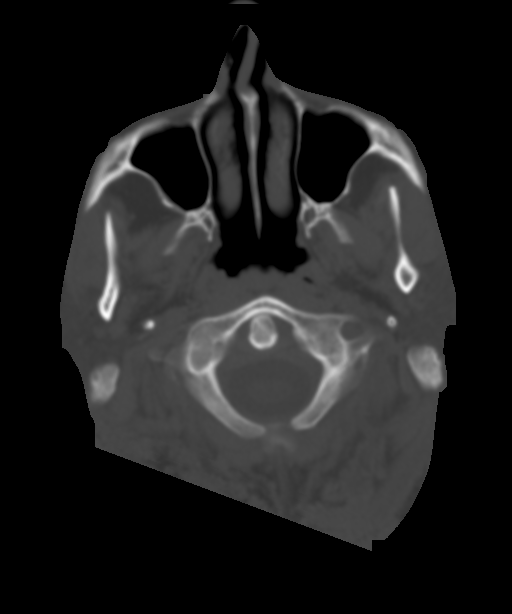
[im 10/37  brain]
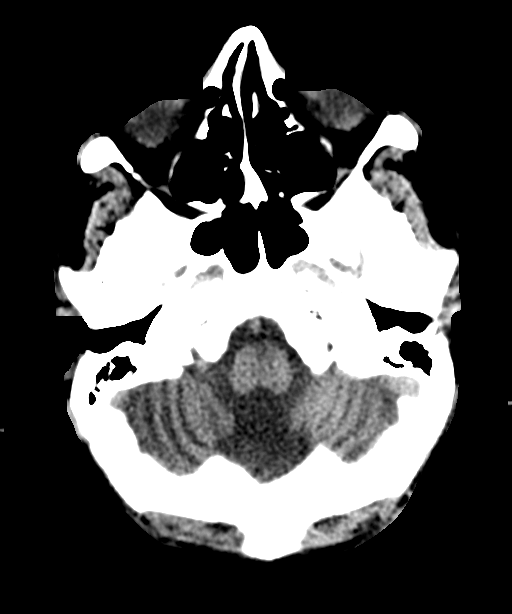
[im 14/37  brain]
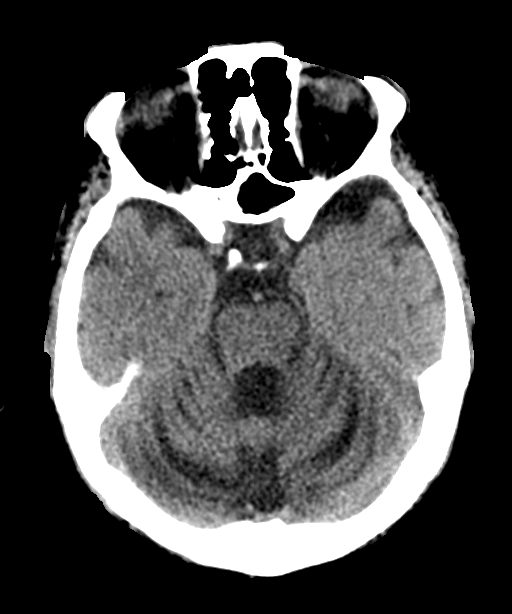
[im 19/37  brain]
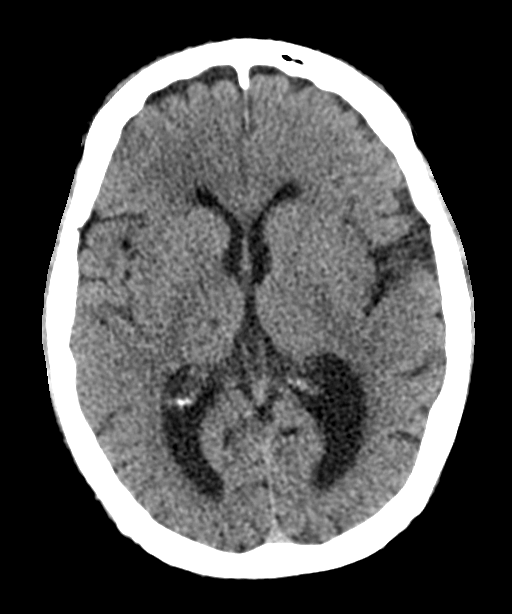
[im 23/37  brain]
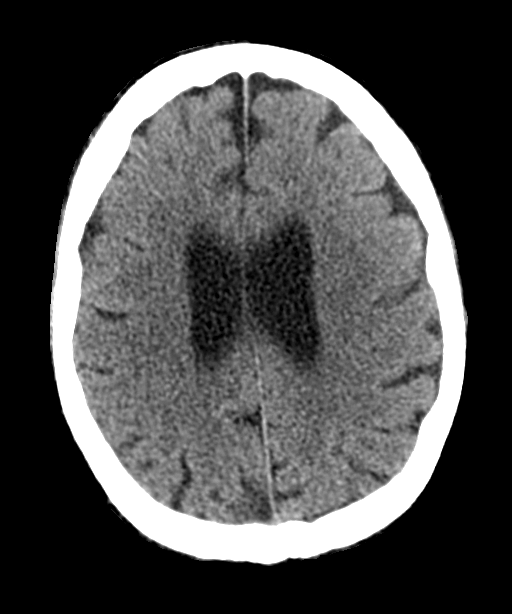
[im 23/37  bone]
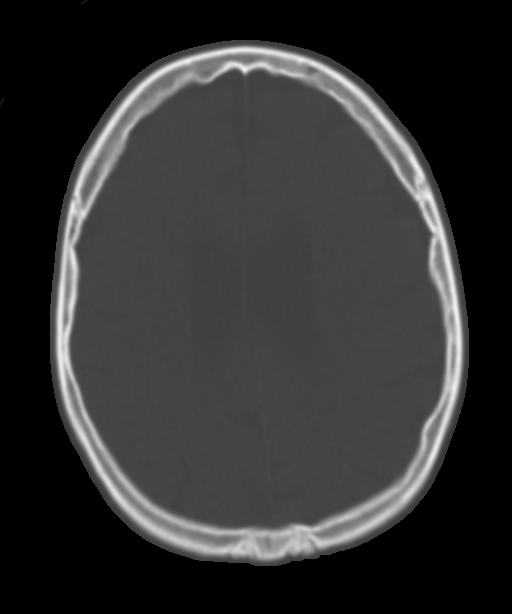
[im 28/37  brain]
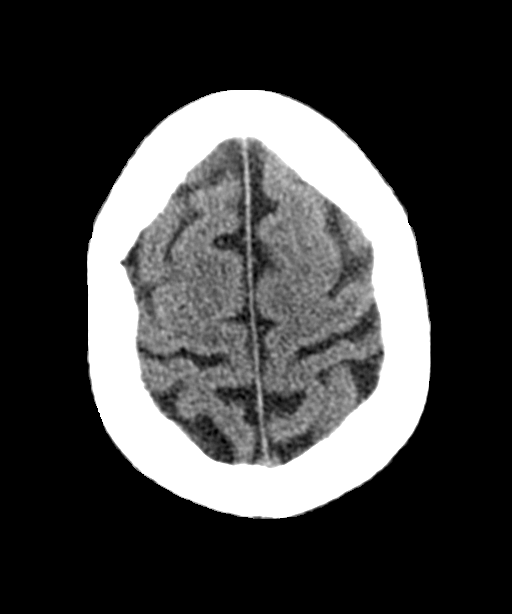
[im 32/37  brain]
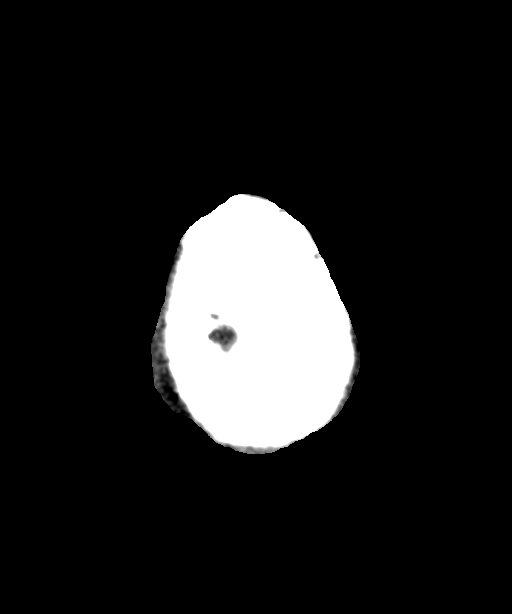

[Series 7: head ax bone · axial · 0.35mm/px · z∈[+31,+47]mm · 2 of 91 slices shown]
[im 10/91  bone]
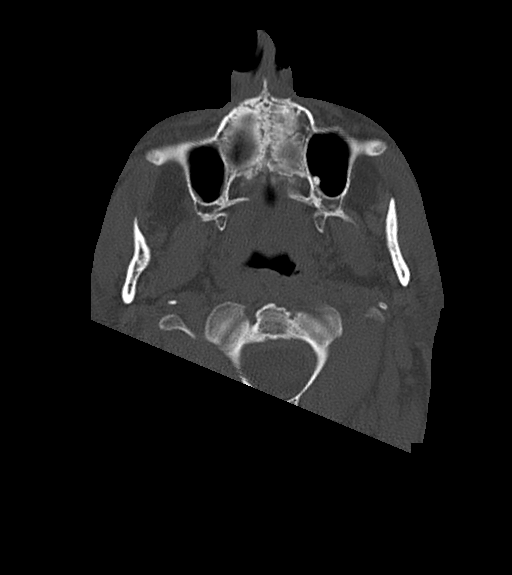
[im 19/91  bone]
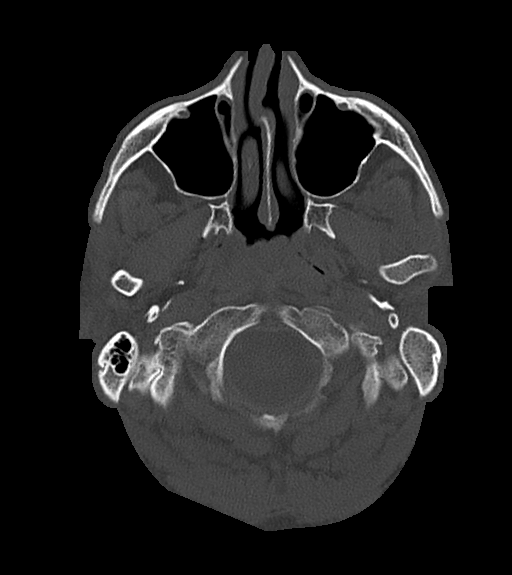

[15 of 47 positions shown; findings below may reference images not displayed]

FINDINGS: Brain: No evidence of acute intracranial hemorrhage or extra-axial
collection.No evidence of mass lesion/concern mass effect.The
ventricles are normal in size.Unchanged chronic lacunar infarct in
the right thalamus.Unchanged cerebellar atrophy.

Vascular: No hyperdense vessel or unexpected calcification.

Skull: Normal. Negative for fracture or focal lesion.

Sinuses/Orbits: No acute finding.

Other: Unchanged partially empty sella.
IMPRESSION: No acute intracranial abnormality.

Unchanged chronic lacunar infarct in the right thalamus.

Unchanged cerebellar atrophy.

## 2022-08-03 IMAGING — DX DG CHEST 1V PORT
1 series · 1 of 1 positions shown · non-contrast
Comparison: 03/03/2020

CLINICAL DATA: Weakness

EXAM:
PORTABLE CHEST 1 VIEW

[chest ap]
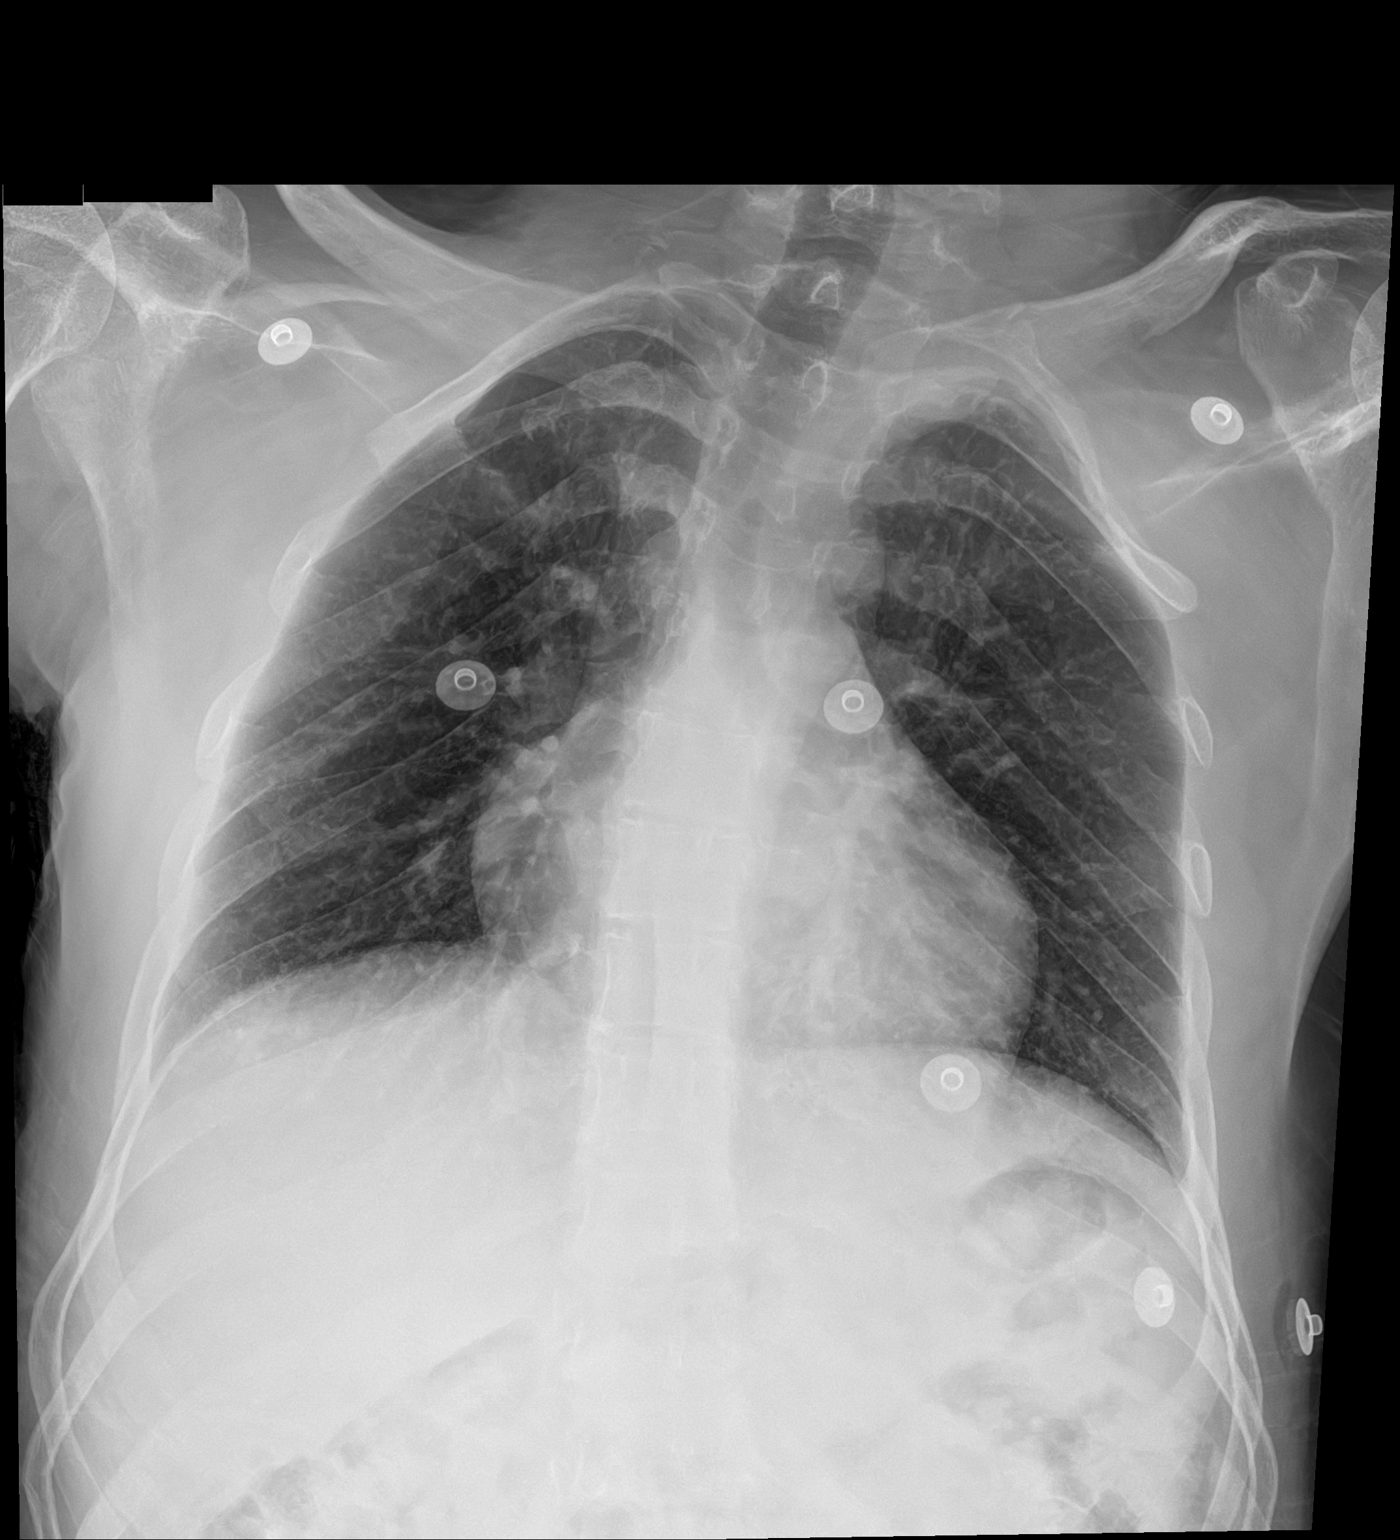

[1 of 1 positions shown; findings below may reference images not displayed]

FINDINGS: The heart size and mediastinal contours are within normal limits. No
focal airspace consolidation, pleural effusion, or pneumothorax. The
visualized skeletal structures are unremarkable.
IMPRESSION: No active disease.

## 2023-03-04 ENCOUNTER — Encounter: Payer: Self-pay | Admitting: Adult Health

## 2023-03-04 ENCOUNTER — Ambulatory Visit (INDEPENDENT_AMBULATORY_CARE_PROVIDER_SITE_OTHER): Payer: MEDICAID | Admitting: Adult Health

## 2023-03-04 VITALS — BP 100/66 | HR 56 | Ht 59.0 in | Wt 114.6 lb

## 2023-03-04 DIAGNOSIS — R569 Unspecified convulsions: Secondary | ICD-10-CM | POA: Diagnosis not present

## 2023-03-04 DIAGNOSIS — Z5181 Encounter for therapeutic drug level monitoring: Secondary | ICD-10-CM | POA: Diagnosis not present

## 2023-03-04 MED ORDER — DIVALPROEX SODIUM ER 250 MG PO TB24
250.0000 mg | ORAL_TABLET | Freq: Every day | ORAL | 5 refills | Status: DC
Start: 2023-03-04 — End: 2023-03-05

## 2023-03-04 MED ORDER — DIVALPROEX SODIUM ER 500 MG PO TB24
500.0000 mg | ORAL_TABLET | Freq: Every day | ORAL | 3 refills | Status: DC
Start: 2023-03-04 — End: 2023-03-05
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
## 2023-03-04 NOTE — Patient Instructions (Addendum)
Your Plan:  Increase Depakote ER to 750mg  nightly (will take 500mg  and 250mg  tablets)  Will check lab work today  Please call office with any additional seizure activity      Follow up in 6 months or call earlier if needed     Thank you for coming to see [place] at [facility]. I hope we have been able to provide you high quality care today.  You may receive a patient satisfaction survey over the next few weeks. We would appreciate your feedback and comments so that we may continue to improve ourselves and the health of our patients.

## 2023-03-05 ENCOUNTER — Telehealth: Payer: Self-pay

## 2023-03-05 DIAGNOSIS — R569 Unspecified convulsions: Secondary | ICD-10-CM

## 2023-03-05 LAB — CBC WITH DIFFERENTIAL/PLATELET
Basophils Absolute: 0 10*3/uL (ref 0.0–0.2)
Basos: 1 %
EOS (ABSOLUTE): 0 10*3/uL (ref 0.0–0.4)
Eos: 0 %
Hematocrit: 39.3 % (ref 37.5–51.0)
Hemoglobin: 13.1 g/dL (ref 13.0–17.7)
Immature Grans (Abs): 0 10*3/uL (ref 0.0–0.1)
Immature Granulocytes: 0 %
Lymphocytes Absolute: 1.5 10*3/uL (ref 0.7–3.1)
Lymphs: 45 %
MCH: 32.2 pg (ref 26.6–33.0)
MCHC: 33.3 g/dL (ref 31.5–35.7)
MCV: 97 fL (ref 79–97)
Monocytes Absolute: 0.4 10*3/uL (ref 0.1–0.9)
Monocytes: 12 %
Neutrophils Absolute: 1.4 10*3/uL (ref 1.4–7.0)
Neutrophils: 42 %
Platelets: 185 10*3/uL (ref 150–450)
RBC: 4.07 x10E6/uL — ABNORMAL LOW (ref 4.14–5.80)
RDW: 12.6 % (ref 11.6–15.4)
WBC: 3.3 10*3/uL — ABNORMAL LOW (ref 3.4–10.8)

## 2023-03-05 LAB — COMPREHENSIVE METABOLIC PANEL
ALT: 13 [IU]/L (ref 0–44)
AST: 19 [IU]/L (ref 0–40)
Albumin: 4.3 g/dL (ref 4.1–5.1)
Alkaline Phosphatase: 74 [IU]/L (ref 44–121)
BUN/Creatinine Ratio: 13 (ref 9–20)
BUN: 13 mg/dL (ref 6–20)
Bilirubin Total: 0.9 mg/dL (ref 0.0–1.2)
CO2: 25 mmol/L (ref 20–29)
Calcium: 9.4 mg/dL (ref 8.7–10.2)
Chloride: 102 mmol/L (ref 96–106)
Creatinine, Ser: 1.01 mg/dL (ref 0.76–1.27)
Globulin, Total: 3.1 g/dL (ref 1.5–4.5)
Glucose: 88 mg/dL (ref 70–99)
Potassium: 4.1 mmol/L (ref 3.5–5.2)
Sodium: 140 mmol/L (ref 134–144)
Total Protein: 7.4 g/dL (ref 6.0–8.5)
eGFR: 99 mL/min/{1.73_m2} (ref >59)

## 2023-03-05 LAB — VALPROIC ACID LEVEL: Valproic Acid Lvl: <4 ug/mL — ABNORMAL LOW (ref 50–100)

## 2023-03-05 MED ORDER — DIVALPROEX SODIUM ER 500 MG PO TB24: 500.0000 mg | ORAL_TABLET | Freq: Every day | ORAL | 3 refills | Status: DC

## 2023-03-05 NOTE — Telephone Encounter (Signed)
Contacted [facility] to inquire about compliance on Depakote as lab work from yesterday did not show any Depakote in his system. Spoke to front desk staff who informed pt resides in HOU[facility]. Will have his nurse Joana Reamer, RN call me back.

## 2023-03-05 NOTE — Telephone Encounter (Addendum)
Received a call from [facility]- RN that works along with the patient. She did some investigating and turns out the pharmacy claims they sent the patient's medication but the staff did not receive it. There was a ball dropped on following through with the pharmacy by the facility and last time it was documented that pt received the medication was 02/11/23.   Advised that the patient should restart the 500 mg dose at bedtime. The patient will restart this dose. And confirmed with Shanda Bumps, NP that the patient will just check labs at April visit.

## 2023-03-05 NOTE — Telephone Encounter (Signed)
-----   Message from Ihor Austin sent at 03/05/2023  9:44 AM EDT ----- Please contact patient's group home to inquire about compliance on Depakote. Lab work from yesterday did not show any Depakote in his system. Due to recent reported seizure, we discussed increasing Depakote dose from 500mg  to 750mg . If he has not been taking Depakote, would recommend restarting at the 500mg  dose and not increasing. Thank you.

## 2023-09-08 ENCOUNTER — Telehealth: Payer: Self-pay | Admitting: Adult Health

## 2023-09-08 NOTE — Telephone Encounter (Signed)
 Care taker called to confirm appointment details

## 2023-09-16 ENCOUNTER — Ambulatory Visit (INDEPENDENT_AMBULATORY_CARE_PROVIDER_SITE_OTHER): Payer: MEDICAID | Admitting: Adult Health

## 2023-09-16 ENCOUNTER — Encounter: Payer: Self-pay | Admitting: Adult Health

## 2023-09-16 VITALS — BP 100/60 | HR 90 | Ht 60.0 in | Wt 111.4 lb

## 2023-09-16 DIAGNOSIS — Z5181 Encounter for therapeutic drug level monitoring: Secondary | ICD-10-CM | POA: Diagnosis not present

## 2023-09-16 DIAGNOSIS — R569 Unspecified convulsions: Secondary | ICD-10-CM | POA: Diagnosis not present

## 2023-09-16 MED ORDER — NAYZILAM 5 MG/0.1ML NA SOLN: 5.0000 mg | NASAL | 0 refills | Status: DC | PRN

## 2023-09-16 MED ORDER — DIVALPROEX SODIUM ER 500 MG PO TB24: 500.0000 mg | ORAL_TABLET | Freq: Every day | ORAL | 3 refills | Status: AC

## 2023-09-16 MED ORDER — DIVALPROEX SODIUM ER 250 MG PO TB24: 250.0000 mg | ORAL_TABLET | Freq: Every day | ORAL | 11 refills | Status: DC
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
## 2023-09-16 NOTE — Patient Instructions (Addendum)
 Your Plan:  Increase depakote  to 750mg  nightly (250mg  + 500mg  tablets)  Please call with any additional seizure activity  Will check lab work today   Order will be placed for Nayzilam  nasal spray to use at onset of seizure - administer 1 spray in nostril at onset of seizure.  Can repeat second spray in other nostril after 10 minutes if needed. Max dose - Treatment of 1 episode every 3 days and treatment of 5 episodes in 1 month.      Follow up in 6-8 months or call earlier if needed     Thank you for coming to see us  at [facility]. I hope we have been able to provide you high quality care today.  You may receive a patient satisfaction survey over the next few weeks. We would appreciate your feedback and comments so that we may continue to improve ourselves and the health of our patients.    Midazolam  Nasal Spray What is this medication? MIDAZOLAM  (MID ay zoe lam) treats seizures. Call emergency services right away if seizures do not stop. You may need additional treatment. It works by calming overactive nerves in your body. It belongs to a group of medications called benzodiazepines. This medicine may be used for other purposes; ask your health care provider or pharmacist if you have questions. COMMON BRAND NAME(S): Nayzilam  What should I tell my care team before I take this medication? They need to know if you have any of these conditions: Glaucoma Heart disease History of substance use disorder Kidney disease Liver disease Lung or breathing disease, such as asthma Mental health condition Suicidal thoughts, plans, or attempt by you or a family member An unusual or allergic reaction to midazolam , other benzodiazepines, foods, dyes, or preservatives Pregnant or trying to get pregnant Breast-feeding How should I use this medication? This medication is for use in the nose. Take it as directed on the prescription label at the same time every day. Do not use  it more often than directed. Make sure that you are using your nasal spray correctly. Ask your care team if you have any questions. A special MedGuide will be given to you by the pharmacist with each prescription and refill. Be sure to read this information carefully each time. Talk to your care team about the use of this medication in children. While it may be prescribed for children as young as 12 years for selected conditions, precautions do apply. Overdosage: If you think you have taken too much of this medicine contact a poison control center or emergency room at once. NOTE: This medicine is only for you. Do not share this medicine with others. What if I miss a dose? This does not apply. What may interact with this medication? Do not take this medication with any of the following: Boceprevir Certain antiviral medications for HIV or AIDS Certain medications for fungal infections like ketoconazole and itraconazole Grapefruit juice Idelalisib Opioid medications for cough Sodium oxybate Telaprevir This medication may also interact with the following: Alcohol Antihistamines for allergy, cough and cold Calcium channel blockers like diltiazem and verapamil Certain antibiotics like clarithromycin, erythromycin, rifampin Certain antiviral medications for HIV or AIDS Certain medications for anxiety or sleep Certain medications for depression, like amitriptyline, fluoxetine, sertraline Certain medications for seizures like carbamazepine, phenobarbital, primidone General anesthetics like halothane, isoflurane, methoxyflurane, propofol  Local anesthetics like lidocaine , pramoxine, tetracaine Medications that relax muscles for surgery Opioid medications for pain Phenothiazines like chlorpromazine , mesoridazine, prochlorperazine, thioridazine This list may not  describe all possible interactions. Give your health care provider a list of all the medicines, herbs, non-prescription drugs, or dietary  supplements you use. Also tell them if you smoke, drink alcohol, or use illegal drugs. Some items may interact with your medicine. What should I watch for while using this medication? Call emergency services right away if seizures do not stop. Visit your care team for regular checks on your progress. Do not stop taking this medication except on your care team's advice. You may develop a severe reaction. Your care team will tell you how much medication to take. Long term use of this medication may cause your brain and body to depend on it. This can happen even when used as directed by your care team. You and your care team will work together to determine how long you will need to take this medication. This medication may affect your coordination, reaction time, or judgment. Do not drive or operate machinery until you know how this medication affects you. Sit up or stand slowly to reduce the risk of dizzy or fainting spells. Drinking alcohol with this medication can increase the risk of these side effects. If you are taking another medication that also causes drowsiness, you may have more side effects. Give your care team a list of all medications you use. Your care team will tell you how much medication to take. Do not take more medication than directed. Get emergency help right away if you have problems breathing or unusual sleepiness. Wear a medical ID bracelet or chain. Carry a card that describes your condition. List the medications and doses you take on the card. If you or your family notice any changes in your behavior, such as new or worsening depression, thoughts of harming yourself, anxiety, other unusual or disturbing thoughts, or memory loss, call your care team right away. Talk to your care team if you wish to become pregnant or think you might be pregnant. This medication can cause serious birth defects. Talk to your care team before breastfeeding. Changes to your treatment plan may be  needed. What side effects may I notice from receiving this medication? Side effects that you should report to your care team as soon as possible: Allergic reactions--skin rash, itching, hives, swelling of the face, lips, tongue, or throat CNS depression--slow or shallow breathing, shortness of breath, feeling faint, dizziness, confusion, trouble staying awake Low blood pressure--dizziness, feeling faint or lightheaded, blurry vision Sudden eye pain or change in vision such as blurry vision, seeing halos around lights, vision loss Thoughts of suicide or self-harm, worsening mood, feelings of depression Side effects that usually do not require medical attention (report to your care team if they continue or are bothersome): Drowsiness Headache Irritation inside the nose or throat Runny or stuffy nose This list may not describe all possible side effects. Call your doctor for medical advice about side effects. You may report side effects to FDA at 1-800-FDA-1088. Where should I keep my medication? Keep out of the reach of children and pets. This medication can be abused. Keep your medication in a safe place to protect it from theft. Do not share this medication with anyone. Selling or giving away this medication is dangerous and against the law. Store at room temperature between 15 and 30 degrees C (59 and 86 degrees F) in the blister package until ready to use. Throw away any unused medication after the expiration date. This medication may cause harm and death if it is taken by other  adults, children, or pets. Return medication that has not been used to an official disposal site. Contact the DEA at 312-598-8757 or your city/county government to find a site. If you cannot return the medication, mix any unused medication with a substance like cat litter or coffee grounds. Then throw the medication away in a sealed container like a sealed bag or coffee can with a lid. Do not use the medication after the  expiration date. NOTE: This sheet is a summary. It may not cover all possible information. If you have questions about this medicine, talk to your doctor, pharmacist, or health care provider.  2024 Elsevier/Gold Standard (2021-03-11 00:00:00)

## 2023-09-17 LAB — CBC WITH DIFFERENTIAL/PLATELET
Basophils Absolute: 0 10*3/uL (ref 0.0–0.2)
Basos: 1 %
EOS (ABSOLUTE): 0 10*3/uL (ref 0.0–0.4)
Eos: 0 %
Hematocrit: 42.3 % (ref 37.5–51.0)
Hemoglobin: 14.5 g/dL (ref 13.0–17.7)
Immature Grans (Abs): 0 10*3/uL (ref 0.0–0.1)
Immature Granulocytes: 0 %
Lymphocytes Absolute: 1.7 10*3/uL (ref 0.7–3.1)
Lymphs: 39 %
MCH: 32.7 pg (ref 26.6–33.0)
MCHC: 34.3 g/dL (ref 31.5–35.7)
MCV: 95 fL (ref 79–97)
Monocytes Absolute: 0.5 10*3/uL (ref 0.1–0.9)
Monocytes: 11 %
Neutrophils Absolute: 2.2 10*3/uL (ref 1.4–7.0)
Neutrophils: 49 %
Platelets: 153 10*3/uL (ref 150–450)
RBC: 4.44 x10E6/uL (ref 4.14–5.80)
RDW: 13.1 % (ref 11.6–15.4)
WBC: 4.4 10*3/uL (ref 3.4–10.8)

## 2023-09-17 LAB — COMPREHENSIVE METABOLIC PANEL WITH GFR
ALT: 16 IU/L (ref 0–44)
AST: 18 IU/L (ref 0–40)
Albumin: 4.7 g/dL (ref 4.1–5.1)
Alkaline Phosphatase: 66 IU/L (ref 44–121)
BUN/Creatinine Ratio: 12 (ref 9–20)
BUN: 11 mg/dL (ref 6–20)
Bilirubin Total: 0.7 mg/dL (ref 0.0–1.2)
CO2: 24 mmol/L (ref 20–29)
Calcium: 9.7 mg/dL (ref 8.7–10.2)
Chloride: 100 mmol/L (ref 96–106)
Creatinine, Ser: 0.89 mg/dL (ref 0.76–1.27)
Globulin, Total: 2.9 g/dL (ref 1.5–4.5)
Glucose: 83 mg/dL (ref 70–99)
Potassium: 3.9 mmol/L (ref 3.5–5.2)
Sodium: 141 mmol/L (ref 134–144)
Total Protein: 7.6 g/dL (ref 6.0–8.5)
eGFR: 114 mL/min/{1.73_m2} (ref >59)

## 2023-09-17 LAB — VALPROIC ACID LEVEL: Valproic Acid Lvl: 34 ug/mL — ABNORMAL LOW (ref 50–100)

## 2023-09-22 ENCOUNTER — Telehealth: Payer: Self-pay | Admitting: Adult Health

## 2023-09-22 ENCOUNTER — Other Ambulatory Visit (HOSPITAL_COMMUNITY): Payer: Self-pay

## 2023-09-22 NOTE — Telephone Encounter (Signed)
 Eddie at [facility] pharmacy is calling regarding the cost of the midazolam  for qty of 6 stated cost is $1000. Asking if we can send to [facility] on [address] in [place] as the cost is cheaper and Camillia Celeste will pick up from [place] and deliver to Pt.  Noted a PA has not been completed for medication, will request to see if this will help the cost.

## 2023-09-22 NOTE — Telephone Encounter (Signed)
 Pharmacist Eddie reports they do not have Midazolam  (NAYZILAM ) 5 MG/0.1ML SOLN and that this needs to be sent to the [facility] in [place], phone rep was able to connect him to RN in POD 3

## 2023-09-23 ENCOUNTER — Other Ambulatory Visit: Payer: Self-pay

## 2023-09-23 ENCOUNTER — Telehealth: Payer: Self-pay

## 2023-09-23 ENCOUNTER — Other Ambulatory Visit (HOSPITAL_COMMUNITY): Payer: Self-pay

## 2023-09-23 ENCOUNTER — Telehealth: Payer: Self-pay | Admitting: *Deleted

## 2023-09-23 MED ORDER — NAYZILAM 5 MG/0.1ML NA SOLN
5.0000 mg | NASAL | 3 refills | Status: DC | PRN
  Filled 2023-09-23: qty 6, 30d supply, fill #0

## 2023-09-23 NOTE — Telephone Encounter (Signed)
 Spoke to [employer] (Print production planner ) at the group home patient resides at. Gave labwork results and gave Jessica,NP recommendation to increase dose of Depakote  as discussed at last office  visit. Print production planner expressed understanding and thanked me for calling

## 2023-09-23 NOTE — Telephone Encounter (Signed)
-----   Message from Johny Nap sent at 09/17/2023  1:41 PM EDT ----- Please advise facility the patient's recent lab work was satisfactory and to continue with increased dose of Depakote  as discussed at yesterday visit.  Thank you.

## 2023-09-23 NOTE — Telephone Encounter (Signed)
 Pharmacy Patient Advocate Encounter   Received notification from Physician's Office that prior authorization for Nayzilam  5MG /0.1ML Soln (Midazolam ) is required/requested.   Insurance verification completed.   The patient is insured through [employer] [place] .   Per test claim: The current 30 day co-pay is, $0.  No PA needed at this time. This test claim was processed through [facility]- copay amounts may vary at other pharmacies due to pharmacy/plan contracts, or as the patient moves through the different stages of their insurance plan.

## 2023-09-23 NOTE — Telephone Encounter (Signed)
 ERROR

## 2023-09-23 NOTE — Telephone Encounter (Signed)
 Spoke w/Scott at [facility] regarding medication. Scott stated they need ofc to send script and they can possibly provide courier express delivery or [employer] and that he will call the group home when the script is ready for pickup if they want to wait on delivery and set up later. Stated it may take 24 hours to fill as he will need to get med from central pharmacy. Expressed thanks. Script routed to provider.  Spoke w/Mitzi and Exec Dir Ms Rouse at [facility] regarding medication Midazolam  for Pt. On 09/22/23 [facility] pharmacy had notified this ofc the Midazolam  for them to fill is $1000 and asked for the script to be sent to [facility] in [place] for a lower cost. Requested a PA for possible lower cost. Notified by PA team, PA not necessary and 30 day copay at [facility] is $0 and [facility] can deliver by mail or courier. Discussed if this is a possibility for this Pt med. Exec Dir stated she is in agreement for the medication to be sent to [facility] and they will provide a pickup for the initial prescription and set up subsequent delivery with the pharmacy. Made them aware script may take 24 hours for fill and [facility] pharmacy will call them when ready for pickup. Stated understanding and thankful for the call.

## 2023-09-23 NOTE — Telephone Encounter (Signed)
 I was asked to do a PA for medication, However PT has not had insurance card scanned into the chart since 2022, No insurance is pulling up in our eligibility tracker. I will try to outreach PT pharmacy to obtain pharmacy benefits information once they open.

## 2023-09-23 NOTE — Telephone Encounter (Addendum)
 Order sent to [facility] as requested. Thank you.

## 2023-09-24 ENCOUNTER — Other Ambulatory Visit: Payer: Self-pay

## 2023-10-05 ENCOUNTER — Other Ambulatory Visit (HOSPITAL_COMMUNITY): Payer: Self-pay

## 2024-04-20 ENCOUNTER — Telehealth: Payer: Self-pay | Admitting: Adult Health

## 2024-04-20 NOTE — Telephone Encounter (Signed)
 Request made to r/s due to conflict

## 2024-04-21 ENCOUNTER — Ambulatory Visit: Payer: MEDICAID | Admitting: Adult Health
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
## 2024-05-27 ENCOUNTER — Encounter: Payer: Self-pay | Admitting: Adult Health

## 2024-05-27 ENCOUNTER — Ambulatory Visit: Payer: MEDICAID | Admitting: Adult Health

## 2024-05-27 VITALS — BP 94/68 | HR 72 | Resp 15 | Wt 108.2 lb

## 2024-05-27 DIAGNOSIS — R569 Unspecified convulsions: Secondary | ICD-10-CM | POA: Diagnosis not present

## 2024-05-27 DIAGNOSIS — Z5181 Encounter for therapeutic drug level monitoring: Secondary | ICD-10-CM

## 2024-05-27 NOTE — Patient Instructions (Signed)
 Your Plan:  Continue Depakote  ER 500mg  nightly for seizure prevention  PLEASE CALL WITH ANY RECURRENT SEIZURE ACTIVITY   Unclear reason why [name] was not received - looks like this was sent to [facility] and they were to contact group home to get this filled. Will follow up with pharmacy next week and keep you notified.      Follow up in 6 months or call earlier if needed      Thank you for coming to see us  at [facility]. I hope we have been able to provide you high quality care today.  You may receive a patient satisfaction survey over the next few weeks. We would appreciate your feedback and comments so that we may continue to improve ourselves and the health of our patients.

## 2024-05-28 LAB — CBC WITH DIFFERENTIAL/PLATELET
Basophils Absolute: 0 x10E3/uL (ref 0.0–0.2)
Basos: 0 %
EOS (ABSOLUTE): 0 x10E3/uL (ref 0.0–0.4)
Eos: 1 %
Hematocrit: 40.2 % (ref 37.5–51.0)
Hemoglobin: 13.6 g/dL (ref 13.0–17.7)
Immature Grans (Abs): 0 x10E3/uL (ref 0.0–0.1)
Immature Granulocytes: 0 %
Lymphocytes Absolute: 1.2 x10E3/uL (ref 0.7–3.1)
Lymphs: 31 %
MCH: 32.3 pg (ref 26.6–33.0)
MCHC: 33.8 g/dL (ref 31.5–35.7)
MCV: 96 fL (ref 79–97)
Monocytes Absolute: 0.5 x10E3/uL (ref 0.1–0.9)
Monocytes: 11 %
Neutrophils Absolute: 2.3 x10E3/uL (ref 1.4–7.0)
Neutrophils: 57 %
Platelets: 176 x10E3/uL (ref 150–450)
RBC: 4.21 x10E6/uL (ref 4.14–5.80)
RDW: 13.2 % (ref 11.6–15.4)
WBC: 4 x10E3/uL (ref 3.4–10.8)

## 2024-05-28 LAB — COMPREHENSIVE METABOLIC PANEL WITH GFR
ALT: 12 IU/L (ref 0–44)
AST: 16 IU/L (ref 0–40)
Albumin: 4.5 g/dL (ref 4.1–5.1)
Alkaline Phosphatase: 63 IU/L (ref 47–123)
BUN/Creatinine Ratio: 15 (ref 9–20)
BUN: 14 mg/dL (ref 6–20)
Bilirubin Total: 0.7 mg/dL (ref 0.0–1.2)
CO2: 25 mmol/L (ref 20–29)
Calcium: 9.5 mg/dL (ref 8.7–10.2)
Chloride: 99 mmol/L (ref 96–106)
Creatinine, Ser: 0.94 mg/dL (ref 0.76–1.27)
Globulin, Total: 3 g/dL (ref 1.5–4.5)
Glucose: 80 mg/dL (ref 70–99)
Potassium: 4.1 mmol/L (ref 3.5–5.2)
Sodium: 137 mmol/L (ref 134–144)
Total Protein: 7.5 g/dL (ref 6.0–8.5)
eGFR: 107 mL/min/1.73

## 2024-05-28 LAB — VALPROIC ACID LEVEL: Valproic Acid Lvl: 34 ug/mL — ABNORMAL LOW (ref 50–100)

## 2024-05-30 ENCOUNTER — Ambulatory Visit: Payer: Self-pay | Admitting: Adult Health

## 2024-05-30 NOTE — Progress Notes (Signed)
 Called group home, mailbox was full unable to leave a message.

## 2024-06-01 NOTE — Progress Notes (Signed)
 After attempting to reach the group home multiple times, I faxed the lab results to the group home number.

## 2024-06-06 NOTE — Progress Notes (Signed)
 [facility] , no answer will try later. I called [facility] pharmacy.  Midazolam  was ready for pt 09-23-2023 and was never picked up by group home.

## 2024-06-07 ENCOUNTER — Other Ambulatory Visit (HOSPITAL_COMMUNITY): Payer: Self-pay

## 2024-12-27 ENCOUNTER — Ambulatory Visit: Payer: MEDICAID | Admitting: Adult Health
